# Patient Record
Sex: Female | Born: 1970 | Race: White | Hispanic: No | Marital: Single | Smoking: Never smoker
Health system: Southern US, Community
[De-identification: ages and names within clinical notes are randomized; demographics above are authoritative.]

## PROBLEM LIST (undated history)

## (undated) DIAGNOSIS — Z8659 Personal history of other mental and behavioral disorders: Secondary | ICD-10-CM

## (undated) DIAGNOSIS — L0591 Pilonidal cyst without abscess: Secondary | ICD-10-CM

## (undated) DIAGNOSIS — R1032 Left lower quadrant pain: Secondary | ICD-10-CM

## (undated) DIAGNOSIS — Z8719 Personal history of other diseases of the digestive system: Secondary | ICD-10-CM

## (undated) DIAGNOSIS — N83202 Unspecified ovarian cyst, left side: Secondary | ICD-10-CM

## (undated) DIAGNOSIS — Z87440 Personal history of urinary (tract) infections: Secondary | ICD-10-CM

## (undated) DIAGNOSIS — Z8669 Personal history of other diseases of the nervous system and sense organs: Secondary | ICD-10-CM

## (undated) HISTORY — DX: Personal history of other diseases of the nervous system and sense organs: Z86.69

## (undated) HISTORY — PX: ESOPHAGOGASTRODUODENOSCOPY: SHX1529

## (undated) HISTORY — PX: PILONIDAL CYST EXCISION: SHX744

## (undated) HISTORY — PX: COLONOSCOPY: SHX174

## (undated) HISTORY — DX: Personal history of other diseases of the digestive system: Z87.19

## (undated) HISTORY — DX: Left lower quadrant pain: R10.32

## (undated) HISTORY — PX: EYE SURGERY: SHX253

## (undated) HISTORY — DX: Personal history of other mental and behavioral disorders: Z86.59

## (undated) HISTORY — DX: Pilonidal cyst without abscess: L05.91

## (undated) HISTORY — DX: Personal history of urinary (tract) infections: Z87.440

## (undated) HISTORY — DX: Unspecified ovarian cyst, left side: N83.202

---

## 2006-11-12 ENCOUNTER — Encounter (HOSPITAL_BASED_OUTPATIENT_CLINIC_OR_DEPARTMENT_OTHER): Admission: RE | Admit: 2006-11-12 | Discharge: 2007-02-10 | Payer: Self-pay | Admitting: Surgery

## 2007-01-15 ENCOUNTER — Encounter (INDEPENDENT_AMBULATORY_CARE_PROVIDER_SITE_OTHER): Payer: Self-pay | Admitting: Surgery

## 2007-01-15 ENCOUNTER — Ambulatory Visit (HOSPITAL_COMMUNITY): Admission: RE | Admit: 2007-01-15 | Discharge: 2007-01-15 | Payer: Self-pay | Admitting: Surgery

## 2007-08-05 ENCOUNTER — Ambulatory Visit (HOSPITAL_BASED_OUTPATIENT_CLINIC_OR_DEPARTMENT_OTHER): Admission: RE | Admit: 2007-08-05 | Discharge: 2007-08-05 | Payer: Self-pay | Admitting: Surgery

## 2008-08-20 IMAGING — CR DG CHEST 2V
2 series · 2 of 2 positions shown · non-contrast
Comparison: none

CLINICAL DATA: 36 year-old female, preoperative study prior to therapy for pilonidal cyst.
 CHEST - 2 VIEW:

[w chest pa *]
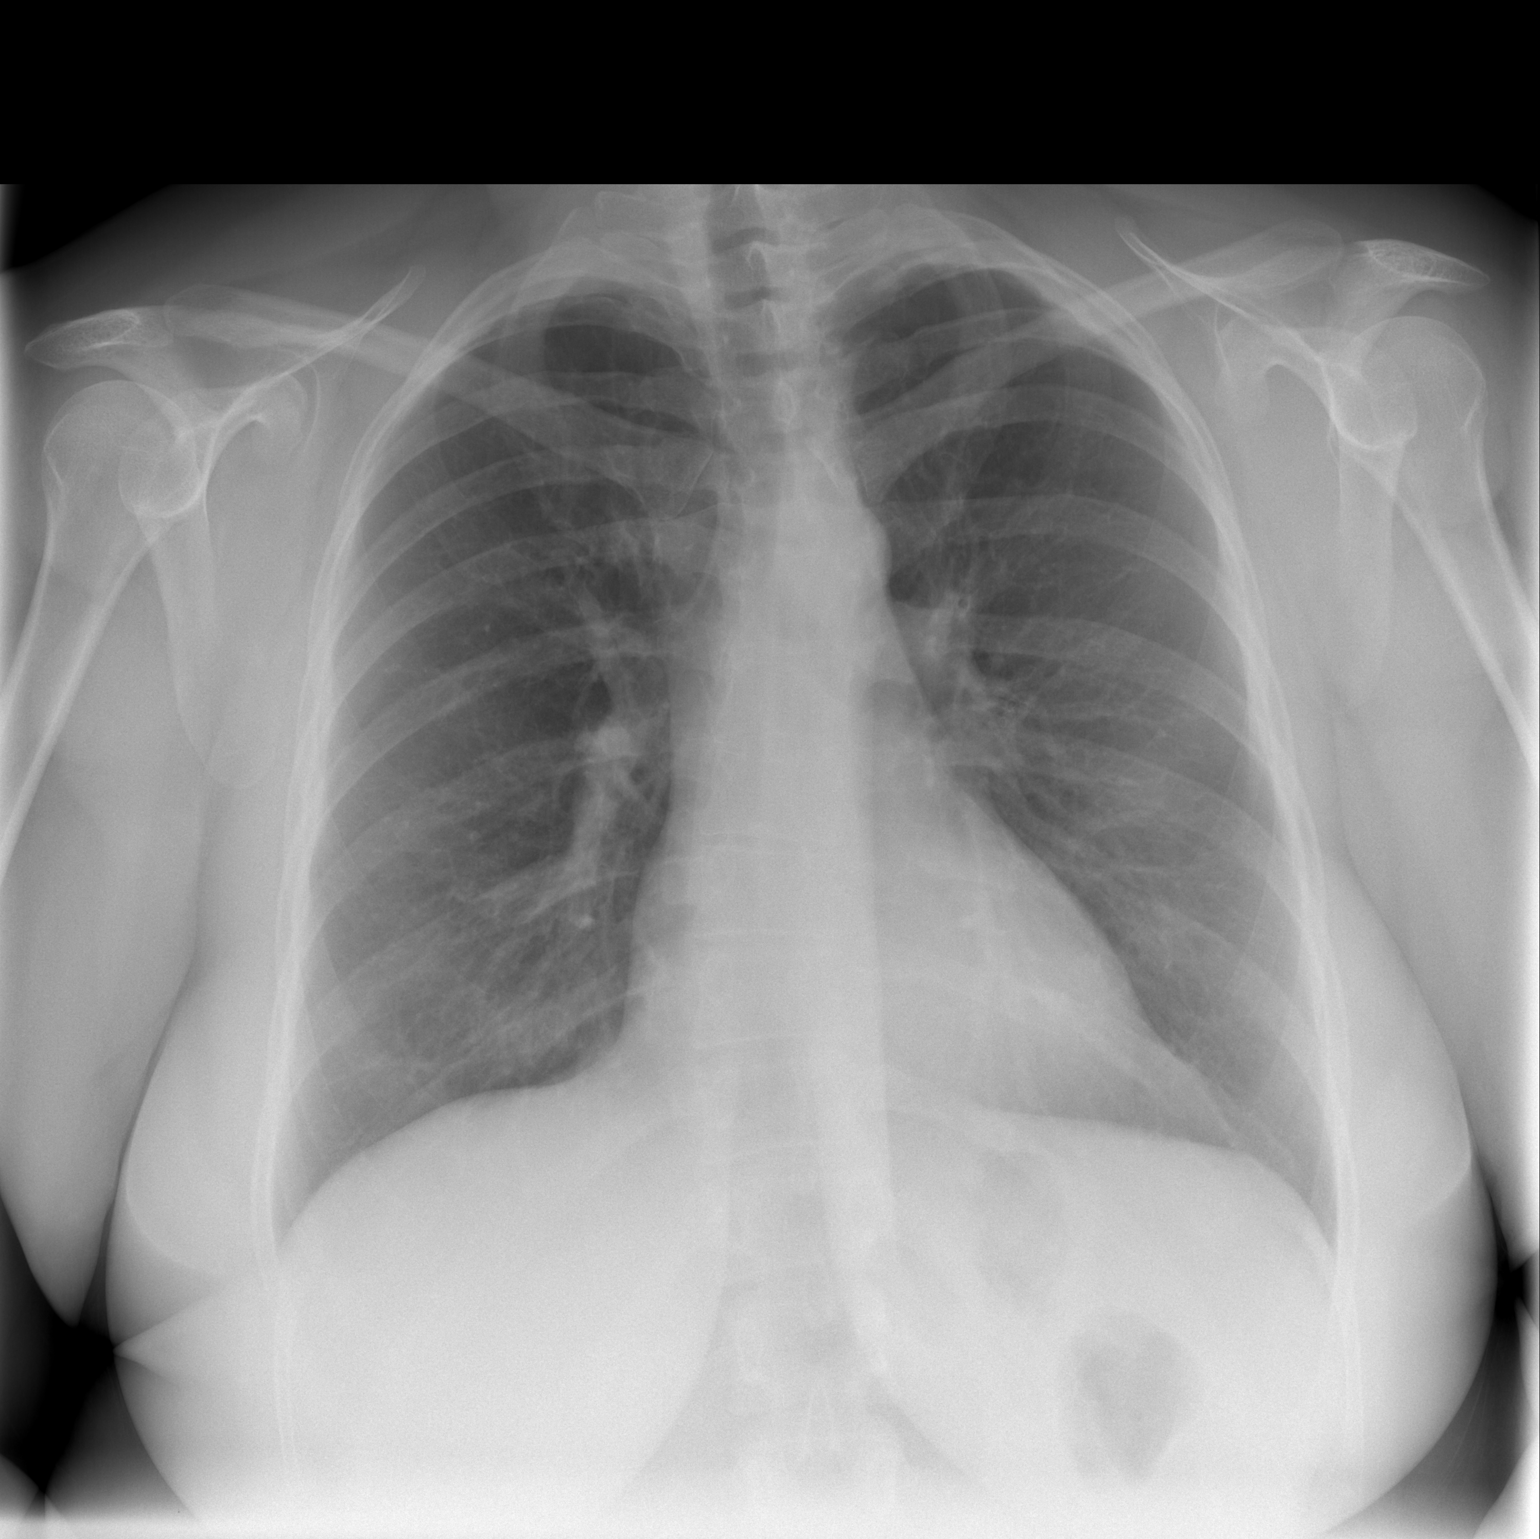

[w chest lat *]
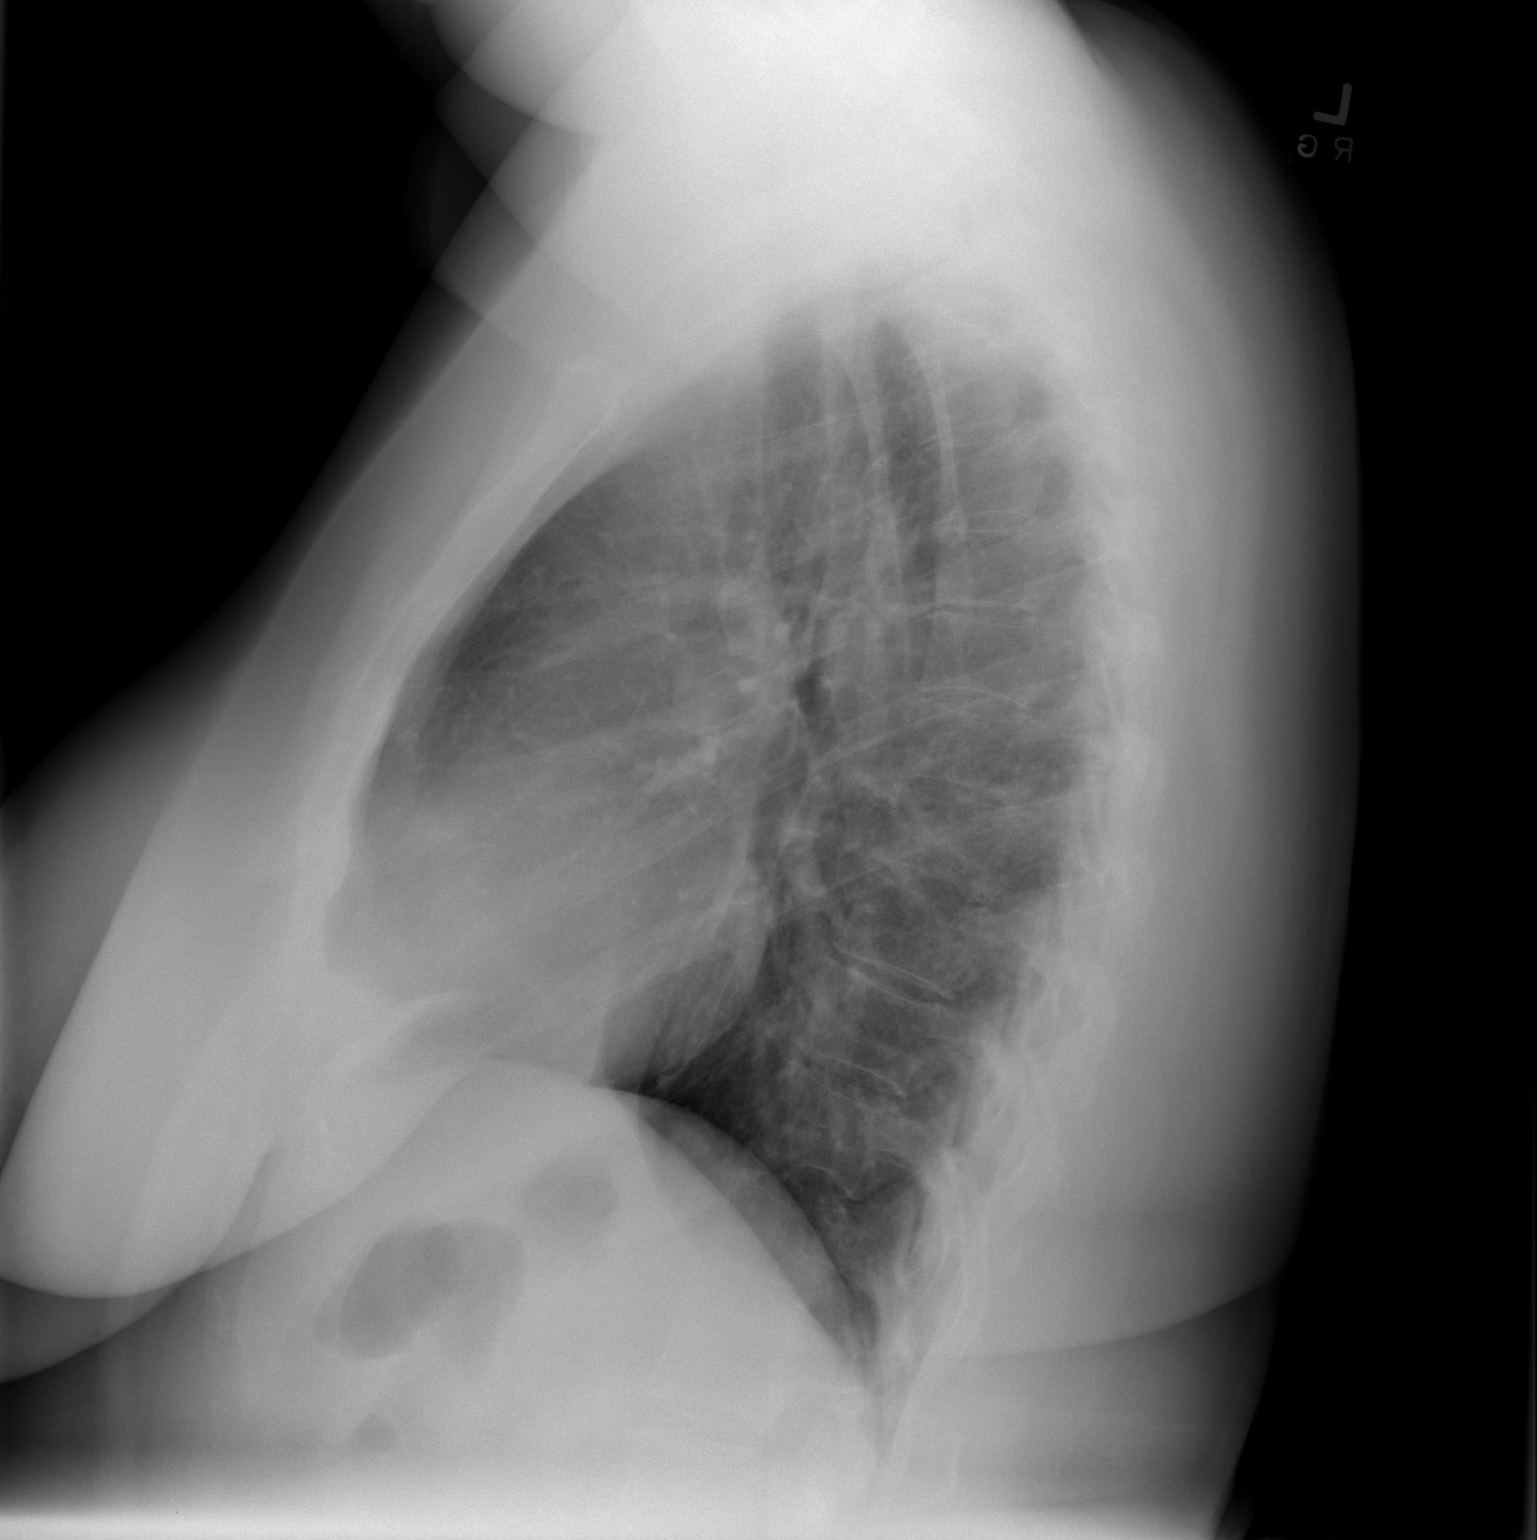

[2 of 2 positions shown; findings below may reference images not displayed]

FINDINGS: Normal cardiac size, mediastinal contour, and lung volumes. The lungs are clear. Mild thoracic scoliosis.  No acute osseous abnormality.
IMPRESSION: No acute cardiopulmonary abnormality.

## 2010-08-27 ENCOUNTER — Other Ambulatory Visit: Payer: Self-pay | Admitting: Internal Medicine

## 2010-08-27 DIAGNOSIS — R102 Pelvic and perineal pain: Secondary | ICD-10-CM

## 2010-08-29 ENCOUNTER — Ambulatory Visit
Admission: RE | Admit: 2010-08-29 | Discharge: 2010-08-29 | Disposition: A | Discharge status: Home / Self Care | Payer: Self-pay | Source: Ambulatory Visit | Attending: Internal Medicine | Admitting: Internal Medicine

## 2010-08-29 DIAGNOSIS — R102 Pelvic and perineal pain: Secondary | ICD-10-CM

## 2010-10-16 NOTE — Op Note (Signed)
NAME:  Catherine Elliott, Catherine Elliott                ACCOUNT NO.:  [id number]   MEDICAL RECORD NO.:  [id number]          PATIENT TYPE:  AMB   LOCATION:  DAY                          FACILITY:  [facility]   PHYSICIAN:  Thomas A. Cornett, M.D.DATE OF BIRTH:  05/12/1971   DATE OF PROCEDURE:  01/15/2007  DATE OF DISCHARGE:                               OPERATIVE REPORT   PREOPERATIVE DIAGNOSIS:  Chronic pilonidal cyst.   POSTOPERATIVE DIAGNOSIS:  Chronic pilonidal cyst.   PROCEDURE:  Excision of pilonidal cyst complex.   SURGEON:  Harriette Bouillon, M.D.   ANESTHESIA:  General endotracheal anesthesia with 20 mL of 0.25%  Sensorcaine with epinephrine.   ESTIMATED BLOOD LOSS:  20 mL.   SPECIMEN:  Pilonidal cyst tract down to the coccyx excised.   INDICATIONS FOR PROCEDURE:  The patient is a 40 year old female with a  complex history of pilonidal cysts which have been recurrent.  She  presents today for excision.   DESCRIPTION OF PROCEDURE:  The patient was brought to the operating  room, placed supine.  After induction of general anesthesia, she was  placed prone.  Buttocks were taped apart.  The gluteal cleft was prepped  and draped in sterile fashion.  Incision was made around the pilonidal  sinus tract in the midline all way down until we encountered the coccyx.  We then excised this area of tissue straight down to the coccyx.  The  entire was excised.  Hemostasis was excellent.  Sufficient skin flaps  were mobilized using electrocautery.  Wound was closed in layers with  two deep layers of 0 Vicryl and 2-0 Vicryl.  A third layer of Vicryl was  used to reapproximate the skin flaps with good coverage.  4-0 Monocryl  was then used to close skin.  All final counts of sponge, needle and  instruments were found to be correct for this portion of the case.  The  patient was then placed supine, extubated and taken to recovery in  satisfactory condition.  All final counts were correct.      Thomas A.  Cornett, M.D.  Electronically Signed     TAC/MEDQ  D:  01/15/2007  T:  01/16/2007  Job:  [id number]   cc:   Candyce Churn. Allyne Gee, M.D.  Fax: [phone]

## 2010-10-16 NOTE — Consult Note (Signed)
Catherine Elliott, Catherine Elliott                ACCOUNT NO.:  [id number]   MEDICAL RECORD NO.:  [id number]          PATIENT TYPE:  REC   LOCATION:  FOOT                         FACILITY:  [facility]   PHYSICIAN:  Jake Shark A. Tanda Rockers, M.D.DATE OF BIRTH:  1970/12/10   DATE OF CONSULTATION:  11/18/2006  DATE OF DISCHARGE:                                 CONSULTATION   REASON FOR CONSULTATION:  Catherine Elliott is a 40 year old lady referred by  Dr. Elnoria Howard for evaluation of ulcer of anus.   IMPRESSION:  Pilonidal cyst.   RECOMMENDATIONS:  General surgical consultation for consideration for  pilonidal cystectomy.   SUBJECTIVE:  Catherine Elliott is a 40 year old lady who has been under the  medical care of Dr. Dorothyann Peng and Dr. Jeani Hawking.  She was  initially evaluated eight months ago for a recurring sore in her rectal  area.  The patient describes minimum pain, but is significantly hampered  by bloody drainage.  She denies constipation, diarrhea.  She has had no  fever, no excessive malodor.  She has been unable to continue her  walking program as a result of increased soilage to her undergarment.   MEDICATIONS:  Remarkable for her not being on any medication.   ALLERGIES:  She denies allergies.   PAST SURGICAL HISTORY:  She has had no surgery.   FAMILY HISTORY:  Positive for colon cancer, diabetes, hypertension.  Negative for stroke.  Negative for heart attack.   SOCIAL HISTORY:  She is divorced.  She is employed by [employer].   REVIEW OF SYSTEMS:  She specifically denies chest pain, shortness of  breath.  She is a nonsmoker.  Her weight has increased due to her  inactivity related to the present lesion in the anal area.  The  remainder of the Review of Systems is negative.   PHYSICAL EXAMINATION:  GENERAL:  She is an alert oriented female in no  acute distress.  She is cooperative and in good contact with reality.  VITAL SIGNS:  Blood pressure is 141/76, respirations 18, pulse rate 78,  temperature  98.1.  HEENT:  Clear.  NECK:  Supple.  Trachea is midline.  Thyroid is nonpalpable.  LUNGS:  Clear.  BREASTS:  Deferred.  ABDOMEN:  Soft.  EXTREMITIES:  Remarkable for no edema.  NEUROLOGICAL:  The patient is grossly intact.  Inspection of the sacral coccygeal area discloses a classic pilonidal  cyst at the junction of the sacrum and coccyx.  There are no anal  lesions whatsoever.  There is mild hyperemia and tenderness.  A Q-tip  was used to sound the sinus, and the pilonidal sinus ends blindly.  There is mild discomfort which is well tolerated by the patient.   DISCUSSION:  The patient on physical exam has a pilonidal cyst.  We have  explained our recommendation that the patient be evaluated by one of the  staff general surgeons and be considered for a pilonidal cystectomy.  We  have encouraged the patient to pursue this option, and if need be we  will be happy to follow her up as sometimes pilonidal cyst  resolution  can be prolonged.  We have given the patient an opportunity to ask  questions.  She seems to understand the diagnosis and the  recommendation.  She expresses gratitude for having been seen in the  clinic, and indicates that she will be compliant as per above.   PRIMARY CARE PHYSICIAN:  Dr. Dorothyann Peng.      Harold A. Tanda Rockers, M.D.  Electronically Signed     HAN/MEDQ  D:  11/18/2006  T:  11/18/2006  Job:  [id number]   cc:   Jordan Hawks. Elnoria Howard, MD  Candyce Churn. Allyne Gee, M.D.

## 2010-10-16 NOTE — Op Note (Signed)
NAME:  Catherine Elliott, Catherine Elliott                ACCOUNT NO.:  [id number]   MEDICAL RECORD NO.:  [id number]          PATIENT TYPE:  AMB   LOCATION:  [facility]                         FACILITY:  [facility]   PHYSICIAN:  Thomas A. Cornett, M.D.DATE OF BIRTH:  1971/02/20   DATE OF PROCEDURE:  08/05/2007  DATE OF DISCHARGE:                               OPERATIVE REPORT   PREOPERATIVE DIAGNOSIS:  Non-healing sacral with recurrent pilonidal  cyst sinus tract.   POSTOPERATIVE DIAGNOSIS:  Non-healing sacral with recurrent pilonidal  cyst sinus tract.   PROCEDURE:  Debridement of recurrent pilonidal cyst sinus tract over  sacrum and coccyx.   SURGEON:  Maisie Fus A. Cornett, M.D.   ANESTHESIA:  General endotracheal anesthesia with 0.25% Sensorcaine.   ESTIMATED BLOOD LOSS:  20 mL.   SPECIMENS:  None.   INDICATIONS FOR PROCEDURE:  The patient is a 40 year old female who had  a pilonidal cyst excised about six months ago.  She has developed small  sinus tracts in the wound that have not healed with conservative  measures.  There was granulation tissue and, unfortunately, until this  was debrided, I did not think this was going to heal.  She presents  today for debridement of this.   DESCRIPTION OF PROCEDURE:  The patient was brought to the operating  suite and intubated supine.  She was then placed prone jackknife,  buttocks taped apart, and appropriately padded.  The wound over her  sacrum was identified.  There were two sinus tracts and these were  probed.  These went quite deep and I was able to connect the two sinus  tracts to each other.  I used cautery cutdown on the sinus tract until I  got to the very bottom.  There is significant granulation tissue down  deep and I used the cautery to destroy this.  I then packed it open with  saline soaked gauze.  Dry dressings were applied.  The patient tolerated  the procedure well, was then placed supine, extubated, and taken to  recovery in satisfactory  condition.  All final counts were correct.      Thomas A. Cornett, M.D.  Electronically Signed     TAC/MEDQ  D:  08/05/2007  T:  08/05/2007  Job:  [id number]

## 2011-02-25 LAB — POCT HEMOGLOBIN-HEMACUE
Hemoglobin: 14.2
Operator id: 268271

## 2011-02-25 LAB — POCT PREGNANCY, URINE
Operator id: 268271
Preg Test, Ur: NEGATIVE

## 2011-03-18 LAB — COMPREHENSIVE METABOLIC PANEL
ALT: 14
AST: 15
Albumin: 3.3 — ABNORMAL LOW
Alkaline Phosphatase: 106
BUN: 6
CO2: 26
Calcium: 9
Chloride: 107
Creatinine, Ser: 0.97
GFR calc Af Amer: >60
GFR calc non Af Amer: >60
Glucose, Bld: 103 — ABNORMAL HIGH
Potassium: 4
Sodium: 139
Total Bilirubin: 0.9
Total Protein: 7.4

## 2011-03-18 LAB — DIFFERENTIAL
Basophils Absolute: 0.1
Basophils Relative: 1
Eosinophils Absolute: 0.1
Eosinophils Relative: 1
Lymphocytes Relative: 28
Lymphs Abs: 2.3
Monocytes Absolute: 0.5
Monocytes Relative: 6
Neutro Abs: 5.2
Neutrophils Relative %: 64

## 2011-03-18 LAB — PREGNANCY, URINE: Preg Test, Ur: NEGATIVE

## 2011-03-18 LAB — CBC
HCT: 40.2
Hemoglobin: 13.8
MCHC: 34.3
MCV: 83.6
Platelets: 340
RBC: 4.8
RDW: 13.1
WBC: 8.2

## 2011-11-27 ENCOUNTER — Ambulatory Visit: Payer: Self-pay | Admitting: Obstetrics and Gynecology

## 2011-12-19 ENCOUNTER — Ambulatory Visit (INDEPENDENT_AMBULATORY_CARE_PROVIDER_SITE_OTHER): Payer: BC Managed Care – PPO | Admitting: Obstetrics and Gynecology

## 2011-12-19 ENCOUNTER — Encounter: Payer: Self-pay | Admitting: Obstetrics and Gynecology

## 2011-12-19 VITALS — BP 110/72 | Ht 67.0 in | Wt 244.0 lb

## 2011-12-19 DIAGNOSIS — Z01419 Encounter for gynecological examination (general) (routine) without abnormal findings: Secondary | ICD-10-CM | POA: Insufficient documentation

## 2011-12-19 DIAGNOSIS — Z124 Encounter for screening for malignant neoplasm of cervix: Secondary | ICD-10-CM

## 2011-12-19 NOTE — Progress Notes (Signed)
Last Pap: 10/22/2010 WNL: Yes Regular Periods:yes Contraception: abst  Monthly Breast exam:no Tetanus<61yrs:yes Nl.Bladder Function:yes Daily BMs:yes Healthy Diet:yes Calcium:no Mammogram:no Date of Mammogram: n/a Exercise:yes Have often Exercise: 3-4x weekly Seatbelt: yes Abuse at home: no Stressful work:no Sigmoid-colonoscopy: <1year ago-wnl per pt Dr.Hung. Pt had no evidence ulcerative colitis at colonoscopy PCP: Dr.Sanders Change in PMH: no change Change in FMH:no change BMI=36 Subjective:    Catherine Elliott is a 41 y.o. female, G0P0000, who presents for an annual exam without complaints.  She had a left ovarian cyst which resolved spontaneously 11/2010    History   Social History  . Marital Status: Single    Spouse Name: N/A    Number of Children: N/A  . Years of Education: N/A   Social History Main Topics  . Smoking status: Never Smoker   . Smokeless tobacco: Never Used  . Alcohol Use: No  . Drug Use: No  . Sexually Active: Not Currently    Birth Control/ Protection: Abstinence   Other Topics Concern  . None   Social History Narrative  . None    Menstrual cycle:   LMP: Patient's last menstrual period was 12/05/2011.           Cycle: regular for the5-7 days and no IM bleeding at the  The following portions of the patient's history were reviewed and updated as appropriate: allergies, current medications, past family history, past medical history, past social history, past surgical history and problem list.  Review of Systems Pertinent items are noted in HPI. Breast:Negative for breast lump,nipple discharge or nipple retraction Gastrointestinal: Negative for abdominal pain, change in bowel habits or rectal bleeding Urinary:negative   Objective:    BP 110/72  Ht 5\' 7"  (1.702 m)  Wt 244 lb (110.678 kg)  BMI 38.22 kg/m2  LMP 12/05/2011    Weight:  Wt Readings from Last 1 Encounters:  12/19/11 244 lb (110.678 kg)          BMI: Body mass index is  38.22 kg/(m^2).  General Appearance: Alert, appropriate appearance for age. No acute distress HEENT: Grossly normal Neck / Thyroid: Supple, no masses, nodes or enlargement Lungs: clear to auscultation bilaterally Back: No CVA tenderness Breast Exam: No masses or nodes.No dimpling, nipple retraction or discharge. Cardiovascular: Regular rate and rhythm. S1, S2, no murmur Gastrointestinal: Soft, non-tender, no masses or organomegaly Pelvic Exam: External genitalia: normal general appearance Vaginal: normal mucosa without prolapse or lesions Cervix: normal appearance Adnexa: non palpable Uterus: does not feel enlarged Exam limited by body habitus Rectovaginal: normal rectal, no masses Lymphatic Exam: Non-palpable nodes in neck, clavicular, axillary, or inguinal regions Skin: no rash or abnormalities Neurologic: Normal gait and speech, no tremor  Psychiatric: Alert and oriented, appropriate affect.   Wet Prep:not applicable Urinalysis:not applicable UPT: Not done   Assessment:    Normal gyn exam compromised by patient's habitus  Hx ovarian cyst , resolved.    Plan:    Mammogram.  Needs first scheduled pap smear return annually or prn STD screening: declined, not sexually active Contraception:declined      Mitsy Owen PMD

## 2011-12-20 ENCOUNTER — Telehealth: Payer: Self-pay

## 2011-12-20 DIAGNOSIS — Z1231 Encounter for screening mammogram for malignant neoplasm of breast: Secondary | ICD-10-CM

## 2011-12-20 NOTE — Telephone Encounter (Signed)
Lm on vm to cb to sched screening mammo per vph.

## 2011-12-20 NOTE — Telephone Encounter (Signed)
Tc from pt per telephone call. Appt sched 01/09/12@11 :20 with [facility]. Pt voices understanding.

## 2011-12-24 LAB — PAP IG AND HPV HIGH-RISK: HPV DNA High Risk: NOT DETECTED

## 2012-01-09 ENCOUNTER — Ambulatory Visit
Admission: RE | Admit: 2012-01-09 | Discharge: 2012-01-09 | Disposition: A | Discharge status: Home / Self Care | Payer: BC Managed Care – PPO | Source: Ambulatory Visit | Attending: Obstetrics and Gynecology | Admitting: Obstetrics and Gynecology

## 2012-01-09 DIAGNOSIS — Z1231 Encounter for screening mammogram for malignant neoplasm of breast: Secondary | ICD-10-CM

## 2012-01-13 ENCOUNTER — Encounter: Payer: Self-pay | Admitting: Obstetrics and Gynecology

## 2013-03-04 ENCOUNTER — Other Ambulatory Visit: Payer: Self-pay

## 2013-03-04 DIAGNOSIS — Z1231 Encounter for screening mammogram for malignant neoplasm of breast: Secondary | ICD-10-CM

## 2013-03-05 ENCOUNTER — Ambulatory Visit
Admission: RE | Admit: 2013-03-05 | Discharge: 2013-03-05 | Disposition: A | Discharge status: Home / Self Care | Payer: BC Managed Care – PPO | Source: Ambulatory Visit

## 2013-03-05 DIAGNOSIS — Z1231 Encounter for screening mammogram for malignant neoplasm of breast: Secondary | ICD-10-CM

## 2013-03-10 ENCOUNTER — Other Ambulatory Visit: Payer: Self-pay | Admitting: Internal Medicine

## 2013-03-10 DIAGNOSIS — R928 Other abnormal and inconclusive findings on diagnostic imaging of breast: Secondary | ICD-10-CM

## 2013-03-23 ENCOUNTER — Other Ambulatory Visit: Payer: Self-pay | Admitting: Internal Medicine

## 2013-03-23 ENCOUNTER — Ambulatory Visit
Admission: RE | Admit: 2013-03-23 | Discharge: 2013-03-23 | Disposition: A | Discharge status: Home / Self Care | Payer: BC Managed Care – PPO | Source: Ambulatory Visit | Attending: Internal Medicine | Admitting: Internal Medicine

## 2013-03-23 DIAGNOSIS — R928 Other abnormal and inconclusive findings on diagnostic imaging of breast: Secondary | ICD-10-CM

## 2013-10-11 ENCOUNTER — Other Ambulatory Visit: Payer: Self-pay | Admitting: Internal Medicine

## 2013-10-11 DIAGNOSIS — N63 Unspecified lump in unspecified breast: Secondary | ICD-10-CM

## 2013-10-18 ENCOUNTER — Ambulatory Visit
Admission: RE | Admit: 2013-10-18 | Discharge: 2013-10-18 | Disposition: A | Discharge status: Home / Self Care | Payer: BC Managed Care – PPO | Source: Ambulatory Visit | Attending: Internal Medicine | Admitting: Internal Medicine

## 2013-10-18 ENCOUNTER — Other Ambulatory Visit: Payer: Self-pay | Admitting: Internal Medicine

## 2013-10-18 ENCOUNTER — Encounter (INDEPENDENT_AMBULATORY_CARE_PROVIDER_SITE_OTHER): Payer: Self-pay

## 2013-10-18 DIAGNOSIS — N63 Unspecified lump in unspecified breast: Secondary | ICD-10-CM

## 2014-09-30 ENCOUNTER — Other Ambulatory Visit (HOSPITAL_COMMUNITY): Payer: Self-pay | Admitting: Internal Medicine

## 2014-09-30 DIAGNOSIS — R1310 Dysphagia, unspecified: Secondary | ICD-10-CM

## 2014-10-10 ENCOUNTER — Ambulatory Visit (HOSPITAL_COMMUNITY)
Admission: RE | Admit: 2014-10-10 | Discharge: 2014-10-10 | Disposition: A | Discharge status: Home / Self Care | Payer: BC Managed Care – PPO | Source: Ambulatory Visit | Attending: Internal Medicine | Admitting: Internal Medicine

## 2014-10-10 DIAGNOSIS — K449 Diaphragmatic hernia without obstruction or gangrene: Secondary | ICD-10-CM | POA: Diagnosis not present

## 2014-10-10 DIAGNOSIS — R1310 Dysphagia, unspecified: Secondary | ICD-10-CM

## 2015-02-23 ENCOUNTER — Other Ambulatory Visit: Payer: Self-pay

## 2015-02-23 DIAGNOSIS — Z1231 Encounter for screening mammogram for malignant neoplasm of breast: Secondary | ICD-10-CM

## 2015-02-24 ENCOUNTER — Ambulatory Visit
Admission: RE | Admit: 2015-02-24 | Discharge: 2015-02-24 | Disposition: A | Discharge status: Home / Self Care | Payer: BC Managed Care – PPO | Source: Ambulatory Visit

## 2015-02-24 DIAGNOSIS — Z1231 Encounter for screening mammogram for malignant neoplasm of breast: Secondary | ICD-10-CM

## 2015-10-09 ENCOUNTER — Ambulatory Visit: Payer: Self-pay

## 2015-10-09 ENCOUNTER — Encounter: Payer: Self-pay | Admitting: Podiatry

## 2015-10-09 ENCOUNTER — Ambulatory Visit (INDEPENDENT_AMBULATORY_CARE_PROVIDER_SITE_OTHER): Payer: BC Managed Care – PPO | Admitting: Podiatry

## 2015-10-09 ENCOUNTER — Ambulatory Visit (INDEPENDENT_AMBULATORY_CARE_PROVIDER_SITE_OTHER): Payer: BC Managed Care – PPO

## 2015-10-09 VITALS — BP 118/59 | HR 72 | Resp 16 | Ht 68.0 in | Wt 210.0 lb

## 2015-10-09 DIAGNOSIS — L84 Corns and callosities: Secondary | ICD-10-CM | POA: Diagnosis not present

## 2015-10-09 DIAGNOSIS — M79672 Pain in left foot: Secondary | ICD-10-CM

## 2015-10-09 DIAGNOSIS — M79671 Pain in right foot: Secondary | ICD-10-CM

## 2015-10-09 DIAGNOSIS — M779 Enthesopathy, unspecified: Secondary | ICD-10-CM | POA: Diagnosis not present

## 2015-10-09 DIAGNOSIS — M216X9 Other acquired deformities of unspecified foot: Secondary | ICD-10-CM

## 2015-10-09 MED ORDER — TRIAMCINOLONE ACETONIDE 10 MG/ML IJ SUSP
10.0000 mg | Freq: Once | INTRAMUSCULAR | Status: AC
  Administered 2015-10-09: 10 mg

## 2015-10-09 NOTE — Progress Notes (Signed)
   Subjective:    Patient ID: Catherine Elliott, female    DOB: 05/27/1971, 45 y.o.   MRN: [id number]  HPI Chief Complaint  Patient presents with  . Painful lesions    Bilateral; lateral side-below 5th toe; pt stated, "Right foot hurts worse"      Review of Systems  Musculoskeletal: Positive for arthralgias.  Hematological: Bruises/bleeds easily.  All other systems reviewed and are negative.      Objective:   Physical Exam        Assessment & Plan:

## 2015-10-10 NOTE — Progress Notes (Signed)
Subjective:     Patient ID: Catherine Elliott, female   DOB: 03/30/1971, 45 y.o.   MRN: [id number]  HPI patient states that both my feet hurt but the right one seems worse and it's bad on this outside area. I developed a corn about 6 months ago and on the left one over the last several months and they're quite painful when I stepped down   Review of Systems  All other systems reviewed and are negative.      Objective:   Physical Exam  Constitutional: She is oriented to person, place, and time.  Cardiovascular: Intact distal pulses.   Musculoskeletal: Normal range of motion.  Neurological: She is oriented to person, place, and time.  Skin: Skin is warm.  Nursing note and vitals reviewed.  neurovascular status intact muscle strength adequate range of motion within normal limits with patient found to have inflammation and fluid around the fifth metatarsal head right that's painful when pressed making walking and shoe gear difficult. Patient has keratotic tissue formation bilateral fifth metatarsals and is found to have good digital perfusion and is well oriented 3     Assessment:     Capsulitis fifth MPJ right over left with keratotic tissue formation and pain    Plan:     H&P x-rays reviewed with patient. Today I injected the plantar capsule right 3 mg Dexon some Kenalog 5 mg Xylocaine and then after appropriate numbness debrided lesions bilateral with no iatrogenic bleeding noted. Gave instructions on physical therapy and reappoint to recheck  X-ray report indicates that there is inflammation around the fifth metatarsal head bilateral

## 2017-06-12 LAB — HM COLONOSCOPY

## 2017-08-07 ENCOUNTER — Other Ambulatory Visit: Payer: Self-pay | Admitting: Internal Medicine

## 2017-08-07 DIAGNOSIS — Z1231 Encounter for screening mammogram for malignant neoplasm of breast: Secondary | ICD-10-CM

## 2017-08-26 ENCOUNTER — Ambulatory Visit
Admission: RE | Admit: 2017-08-26 | Discharge: 2017-08-26 | Disposition: A | Discharge status: Home / Self Care | Payer: BC Managed Care – PPO | Source: Ambulatory Visit | Attending: Internal Medicine | Admitting: Internal Medicine

## 2017-08-26 DIAGNOSIS — Z1231 Encounter for screening mammogram for malignant neoplasm of breast: Secondary | ICD-10-CM

## 2018-04-22 ENCOUNTER — Other Ambulatory Visit: Payer: Self-pay | Admitting: Internal Medicine

## 2018-05-18 ENCOUNTER — Other Ambulatory Visit: Payer: Self-pay | Admitting: Internal Medicine

## 2018-06-03 ENCOUNTER — Other Ambulatory Visit: Payer: Self-pay | Admitting: Internal Medicine

## 2018-07-29 ENCOUNTER — Other Ambulatory Visit: Payer: Self-pay | Admitting: Internal Medicine

## 2018-07-29 DIAGNOSIS — Z1231 Encounter for screening mammogram for malignant neoplasm of breast: Secondary | ICD-10-CM

## 2018-08-17 ENCOUNTER — Other Ambulatory Visit: Payer: Self-pay | Admitting: Internal Medicine

## 2018-08-31 ENCOUNTER — Inpatient Hospital Stay: Admission: RE | Admit: 2018-08-31 | Payer: BC Managed Care – PPO | Source: Ambulatory Visit

## 2018-09-10 ENCOUNTER — Encounter: Payer: BC Managed Care – PPO | Admitting: Internal Medicine

## 2018-10-15 ENCOUNTER — Other Ambulatory Visit: Payer: Self-pay | Admitting: Internal Medicine

## 2019-02-09 ENCOUNTER — Other Ambulatory Visit: Payer: Self-pay

## 2019-02-09 ENCOUNTER — Encounter: Payer: Self-pay | Admitting: Internal Medicine

## 2019-02-09 ENCOUNTER — Ambulatory Visit: Payer: BC Managed Care – PPO | Admitting: Internal Medicine

## 2019-02-09 VITALS — BP 130/72 | HR 90 | Temp 98.5°F | Ht 65.2 in | Wt 261.2 lb

## 2019-02-09 DIAGNOSIS — Z6841 Body Mass Index (BMI) 40.0 and over, adult: Secondary | ICD-10-CM

## 2019-02-09 DIAGNOSIS — Z0000 Encounter for general adult medical examination without abnormal findings: Secondary | ICD-10-CM

## 2019-02-09 DIAGNOSIS — G8929 Other chronic pain: Secondary | ICD-10-CM | POA: Diagnosis not present

## 2019-02-09 DIAGNOSIS — Z712 Person consulting for explanation of examination or test findings: Secondary | ICD-10-CM | POA: Diagnosis not present

## 2019-02-09 DIAGNOSIS — R1011 Right upper quadrant pain: Secondary | ICD-10-CM | POA: Diagnosis not present

## 2019-02-09 LAB — POCT URINALYSIS DIPSTICK
Blood, UA: NEGATIVE
Glucose, UA: NEGATIVE
Nitrite, UA: NEGATIVE
Protein, UA: POSITIVE — AB
Spec Grav, UA: 1.025 (ref 1.010–1.025)
Urobilinogen, UA: 0.2 E.U./dL
pH, UA: 5.5 (ref 5.0–8.0)

## 2019-02-09 NOTE — Progress Notes (Signed)
Subjective:     Patient ID: Catherine Elliott , female    DOB: [birth year] , 48 y.o.   MRN: [id number]   Chief Complaint  Patient presents with  . Annual Exam    HPI  She is here today for a full physical examination. She is followed by GYN for her pelvic exams. Admits that she has not been seen in "years". She was previously seen by Dr. Leo Grosser; however, she heard she has since retired.     Past Medical History:  Diagnosis Date  . H/O ulcerative colitis   . H/O: depression   . History of migraine headaches    frequent  . Hx: UTI (urinary tract infection)   . Left ovarian cyst    resolved 11/2010  . LLQ pain    h/o  . Pilonidal cyst    h/o x 2     Family History  Problem Relation Age of Onset  . COPD Mother   . Cancer Mother        ovarian  . Hypertension Mother   . Heart disease Father   . Nephrolithiasis Father   . Hypertension Father   . Diabetes Father   . Seizures Father   . Mental illness Sister        bipolar   . Seizures Brother   . Mental illness Brother        bipolar  . Cirrhosis Brother        ETOH use  . Cancer Maternal Aunt        stomach and uterine  . Cancer Maternal Grandmother        ovarian  . Cancer Paternal Grandmother        pancreatic     Current Outpatient Medications:  .  cholestyramine (QUESTRAN) 4 GM/DOSE powder, Take by mouth 3 (three) times daily with meals., Disp: , Rfl:  .  famotidine (PEPCID) 20 MG tablet, TAKE 1 TABLET BY MOUTH TWICE A DAY, Disp: 60 tablet, Rfl: 1 .  omeprazole (PRILOSEC) 40 MG capsule, Take 40 mg by mouth daily., Disp: , Rfl:    No Known Allergies    The patient states she uses none for birth control. Last LMP was Patient's last menstrual period was 01/31/2019.. Negative for Dysmenorrhea  Negative for: breast discharge, breast lump(s), breast pain and breast self exam. Associated symptoms include abnormal vaginal bleeding. Pertinent negatives include abnormal bleeding (hematology), anxiety, decreased libido,  depression, difficulty falling sleep, dyspareunia, history of infertility, nocturia, sexual dysfunction, sleep disturbances, urinary incontinence, urinary urgency, vaginal discharge and vaginal itching. Diet regular.The patient states her exercise level is  intermittent.  . The patient's tobacco use is:  Social History   Tobacco Use  Smoking Status Never Smoker  Smokeless Tobacco Never Used  . She has been exposed to passive smoke. The patient's alcohol use is:  Social History   Substance and Sexual Activity  Alcohol Use No    Review of Systems  Constitutional: Negative.   HENT: Negative.   Eyes: Negative.   Respiratory: Negative.   Cardiovascular: Negative.   Gastrointestinal:       She c/o intermittent nausea and RUQ pain. Had recent ER evaluation - diagnosed w/ "stomach flu". Yet CT abdomen revealed cholelithiasis without inflammation. She has been having sx for the past 3 months or so. She is also followed by GI.   Endocrine: Negative.   Genitourinary: Negative.   Musculoskeletal: Negative.   Skin: Negative.   Allergic/Immunologic: Negative.   Neurological: Negative.  Hematological: Negative.   Psychiatric/Behavioral: Negative.      Today's Vitals   02/09/19 0858  BP: 130/72  Pulse: 90  Temp: 98.5 F (36.9 C)  TempSrc: Oral  SpO2: 98%  Weight: 261 lb 3.2 oz (118.5 kg)  Height: 5' 5.2" (1.656 m)   Body mass index is 43.2 kg/m.   Objective:  Physical Exam Vitals signs and nursing note reviewed.  Constitutional:      Appearance: Normal appearance. She is obese.  HENT:     Head: Normocephalic and atraumatic.     Right Ear: Tympanic membrane, ear canal and external ear normal.     Left Ear: Tympanic membrane, ear canal and external ear normal.     Nose: Nose normal.     Mouth/Throat:     Mouth: Mucous membranes are moist.     Pharynx: Oropharynx is clear.  Eyes:     Extraocular Movements: Extraocular movements intact.     Conjunctiva/sclera: Conjunctivae  normal.     Pupils: Pupils are equal, round, and reactive to light.  Neck:     Musculoskeletal: Normal range of motion and neck supple.  Cardiovascular:     Rate and Rhythm: Normal rate and regular rhythm.     Pulses: Normal pulses.     Heart sounds: Normal heart sounds.  Pulmonary:     Effort: Pulmonary effort is normal.     Breath sounds: Normal breath sounds.  Abdominal:     General: Bowel sounds are normal.     Palpations: Abdomen is soft.     Tenderness: There is abdominal tenderness.     Comments: Obese, difficult to assess organomegaly. RUQ tenderness to palpation  Genitourinary:    Comments: deferred Musculoskeletal: Normal range of motion.  Skin:    General: Skin is warm and dry.  Neurological:     General: No focal deficit present.     Mental Status: She is alert and oriented to person, place, and time.  Psychiatric:        Mood and Affect: Mood normal.        Behavior: Behavior normal.         Assessment And Plan:     1. Routine general medical examination at health care facility  A full exam was performed.  Importance of monthly self breast exams was discussed with the patient.  I will refer her back to [facility] for her GYN care.  PATIENT HAS BEEN ADVISED TO GET 30-45 MINUTES REGULAR EXERCISE NO LESS THAN FOUR TO FIVE DAYS PER WEEK - BOTH WEIGHTBEARING EXERCISES AND AEROBIC ARE RECOMMENDED.  SHE WAS ADVISED TO FOLLOW A HEALTHY DIET WITH AT LEAST SIX FRUITS/VEGGIES PER DAY, DECREASE INTAKE OF RED MEAT, AND TO INCREASE FISH INTAKE TO TWO DAYS PER WEEK.  MEATS/FISH SHOULD NOT BE FRIED, BAKED OR BROILED IS PREFERABLE.  I SUGGEST WEARING SPF 50 SUNSCREEN ON EXPOSED PARTS AND ESPECIALLY WHEN IN THE DIRECT SUNLIGHT FOR AN EXTENDED PERIOD OF TIME.  PLEASE AVOID FAST FOOD RESTAURANTS AND INCREASE YOUR WATER INTAKE.  - Ambulatory referral to Gynecology - CMP14+EGFR - CBC - Lipid panel - Hemoglobin A1c - TSH - POCT Urinalysis Dipstick (81002)  2. Chronic RUQ pain  CT scan  results reviewed in full detail. I will make further recommendations once her labs are available for review.   - NM Hepato W/Eject Fract; Future  3. Class 3 severe obesity due to excess calories without serious comorbidity with body mass index (BMI) of 40.0 to 44.9 in adult (  [place])  She reports losing 10-15 pounds in the past 2 months by decreasing her food intake (due to GI discomfort) and increasing her water intake. She admits she is not yet exercising on a regular basis. She is encouraged to incorporate more activity into her daily routine.   Maximino Greenland, MD    THE PATIENT IS ENCOURAGED TO PRACTICE SOCIAL DISTANCING DUE TO THE COVID-19 PANDEMIC.

## 2019-02-09 NOTE — Patient Instructions (Signed)
Health Maintenance, Female Adopting a healthy lifestyle and getting preventive care are important in promoting health and wellness. Ask your health care provider about:  The right schedule for you to have regular tests and exams.  Things you can do on your own to prevent diseases and keep yourself healthy. What should I know about diet, weight, and exercise? Eat a healthy diet   Eat a diet that includes plenty of vegetables, fruits, low-fat dairy products, and lean protein.  Do not eat a lot of foods that are high in solid fats, added sugars, or sodium. Maintain a healthy weight Body mass index (BMI) is used to identify weight problems. It estimates body fat based on height and weight. Your health care provider can help determine your BMI and help you achieve or maintain a healthy weight. Get regular exercise Get regular exercise. This is one of the most important things you can do for your health. Most adults should:  Exercise for at least 150 minutes each week. The exercise should increase your heart rate and make you sweat (moderate-intensity exercise).  Do strengthening exercises at least twice a week. This is in addition to the moderate-intensity exercise.  Spend less time sitting. Even light physical activity can be beneficial. Watch cholesterol and blood lipids Have your blood tested for lipids and cholesterol at 50 years of age, then have this test every 5 years. Have your cholesterol levels checked more often if:  Your lipid or cholesterol levels are high.  You are older than 50 years of age.  You are at high risk for heart disease. What should I know about cancer screening? Depending on your health history and family history, you may need to have cancer screening at various ages. This may include screening for:  Breast cancer.  Cervical cancer.  Colorectal cancer.  Skin cancer.  Lung cancer. What should I know about heart disease, diabetes, and high blood  pressure? Blood pressure and heart disease  High blood pressure causes heart disease and increases the risk of stroke. This is more likely to develop in people who have high blood pressure readings, are of African descent, or are overweight.  Have your blood pressure checked: ? Every 3-5 years if you are 18-39 years of age. ? Every year if you are 40 years old or older. Diabetes Have regular diabetes screenings. This checks your fasting blood sugar level. Have the screening done:  Once every three years after age 40 if you are at a normal weight and have a low risk for diabetes.  More often and at a younger age if you are overweight or have a high risk for diabetes. What should I know about preventing infection? Hepatitis B If you have a higher risk for hepatitis B, you should be screened for this virus. Talk with your health care provider to find out if you are at risk for hepatitis B infection. Hepatitis C Testing is recommended for:  Everyone born from 1945 through 1965.  Anyone with known risk factors for hepatitis C. Sexually transmitted infections (STIs)  Get screened for STIs, including gonorrhea and chlamydia, if: ? You are sexually active and are younger than 50 years of age. ? You are older than 50 years of age and your health care provider tells you that you are at risk for this type of infection. ? Your sexual activity has changed since you were last screened, and you are at increased risk for chlamydia or gonorrhea. Ask your health care provider if   you are at risk.  Ask your health care provider about whether you are at high risk for HIV. Your health care provider may recommend a prescription medicine to help prevent HIV infection. If you choose to take medicine to prevent HIV, you should first get tested for HIV. You should then be tested every 3 months for as long as you are taking the medicine. Pregnancy  If you are about to stop having your period (premenopausal) and  you may become pregnant, seek counseling before you get pregnant.  Take 400 to 800 micrograms (mcg) of folic acid every day if you become pregnant.  Ask for birth control (contraception) if you want to prevent pregnancy. Osteoporosis and menopause Osteoporosis is a disease in which the bones lose minerals and strength with aging. This can result in bone fractures. If you are 65 years old or older, or if you are at risk for osteoporosis and fractures, ask your health care provider if you should:  Be screened for bone loss.  Take a calcium or vitamin D supplement to lower your risk of fractures.  Be given hormone replacement therapy (HRT) to treat symptoms of menopause. Follow these instructions at home: Lifestyle  Do not use any products that contain nicotine or tobacco, such as cigarettes, e-cigarettes, and chewing tobacco. If you need help quitting, ask your health care provider.  Do not use street drugs.  Do not share needles.  Ask your health care provider for help if you need support or information about quitting drugs. Alcohol use  Do not drink alcohol if: ? Your health care provider tells you not to drink. ? You are pregnant, may be pregnant, or are planning to become pregnant.  If you drink alcohol: ? Limit how much you use to 0-1 drink a day. ? Limit intake if you are breastfeeding.  Be aware of how much alcohol is in your drink. In the [place] one drink equals one 12 oz bottle of beer (355 mL), one 5 oz glass of wine (148 mL), or one 1 oz glass of hard liquor (44 mL). General instructions  Schedule regular health, dental, and eye exams.  Stay current with your vaccines.  Tell your health care provider if: ? You often feel depressed. ? You have ever been abused or do not feel safe at home. Summary  Adopting a healthy lifestyle and getting preventive care are important in promoting health and wellness.  Follow your health care provider's instructions about healthy  diet, exercising, and getting tested or screened for diseases.  Follow your health care provider's instructions on monitoring your cholesterol and blood pressure. This information is not intended to replace advice given to you by your health care provider. Make sure you discuss any questions you have with your health care provider. Document Released: 12/03/2010 Document Revised: 05/13/2018 Document Reviewed: 05/13/2018 Elsevier Patient Education  2020 [employer]  

## 2019-02-10 LAB — CMP14+EGFR
ALT: 27 IU/L (ref 0–32)
AST: 15 IU/L (ref 0–40)
Albumin/Globulin Ratio: 1.3 (ref 1.2–2.2)
Albumin: 4.2 g/dL (ref 3.8–4.8)
Alkaline Phosphatase: 135 IU/L — ABNORMAL HIGH (ref 39–117)
BUN/Creatinine Ratio: 9 (ref 9–23)
BUN: 8 mg/dL (ref 6–24)
Bilirubin Total: 0.4 mg/dL (ref 0.0–1.2)
CO2: 21 mmol/L (ref 20–29)
Calcium: 9.6 mg/dL (ref 8.7–10.2)
Chloride: 103 mmol/L (ref 96–106)
Creatinine, Ser: 0.9 mg/dL (ref 0.57–1.00)
GFR calc Af Amer: 86 mL/min/{1.73_m2} (ref >59)
GFR calc non Af Amer: 75 mL/min/{1.73_m2} (ref >59)
Globulin, Total: 3.3 g/dL (ref 1.5–4.5)
Glucose: 101 mg/dL — ABNORMAL HIGH (ref 65–99)
Potassium: 3.9 mmol/L (ref 3.5–5.2)
Sodium: 141 mmol/L (ref 134–144)
Total Protein: 7.5 g/dL (ref 6.0–8.5)

## 2019-02-10 LAB — CBC
Hematocrit: 43 % (ref 34.0–46.6)
Hemoglobin: 14 g/dL (ref 11.1–15.9)
MCH: 29.2 pg (ref 26.6–33.0)
MCHC: 32.6 g/dL (ref 31.5–35.7)
MCV: 90 fL (ref 79–97)
Platelets: 346 10*3/uL (ref 150–450)
RBC: 4.8 x10E6/uL (ref 3.77–5.28)
RDW: 12 % (ref 11.7–15.4)
WBC: 7.6 10*3/uL (ref 3.4–10.8)

## 2019-02-10 LAB — LIPID PANEL
Chol/HDL Ratio: 5.3 ratio — ABNORMAL HIGH (ref 0.0–4.4)
Cholesterol, Total: 191 mg/dL (ref 100–199)
HDL: 36 mg/dL — ABNORMAL LOW (ref >39)
LDL Chol Calc (NIH): 127 mg/dL — ABNORMAL HIGH (ref 0–99)
Triglycerides: 156 mg/dL — ABNORMAL HIGH (ref 0–149)
VLDL Cholesterol Cal: 28 mg/dL (ref 5–40)

## 2019-02-10 LAB — HEMOGLOBIN A1C
Est. average glucose Bld gHb Est-mCnc: 105 mg/dL
Hgb A1c MFr Bld: 5.3 % (ref 4.8–5.6)

## 2019-02-10 LAB — TSH: TSH: 2.88 u[IU]/mL (ref 0.450–4.500)

## 2019-02-15 ENCOUNTER — Encounter: Payer: Self-pay | Admitting: Internal Medicine

## 2019-02-24 ENCOUNTER — Other Ambulatory Visit: Payer: Self-pay

## 2019-02-24 ENCOUNTER — Encounter (HOSPITAL_COMMUNITY)
Admission: RE | Admit: 2019-02-24 | Discharge: 2019-02-24 | Disposition: A | Discharge status: Home / Self Care | Payer: BC Managed Care – PPO | Source: Ambulatory Visit | Attending: Internal Medicine | Admitting: Internal Medicine

## 2019-02-24 DIAGNOSIS — G8929 Other chronic pain: Secondary | ICD-10-CM | POA: Diagnosis present

## 2019-02-24 DIAGNOSIS — R1011 Right upper quadrant pain: Secondary | ICD-10-CM | POA: Insufficient documentation

## 2019-02-24 MED ORDER — TECHNETIUM TC 99M MEBROFENIN IV KIT
5.2000 | PACK | Freq: Once | INTRAVENOUS | Status: AC | PRN
Start: 2019-02-24 — End: 2019-02-24
  Administered 2019-02-24: 08:00:00 5.2 via INTRAVENOUS

## 2019-02-25 ENCOUNTER — Encounter: Payer: Self-pay | Admitting: Internal Medicine

## 2019-03-01 ENCOUNTER — Encounter: Payer: Self-pay | Admitting: Internal Medicine

## 2019-03-12 ENCOUNTER — Ambulatory Visit
Admission: RE | Admit: 2019-03-12 | Discharge: 2019-03-12 | Disposition: A | Discharge status: Home / Self Care | Payer: BC Managed Care – PPO | Source: Ambulatory Visit | Attending: Internal Medicine | Admitting: Internal Medicine

## 2019-03-12 ENCOUNTER — Other Ambulatory Visit: Payer: Self-pay

## 2019-03-12 DIAGNOSIS — Z1231 Encounter for screening mammogram for malignant neoplasm of breast: Secondary | ICD-10-CM

## 2019-03-22 ENCOUNTER — Ambulatory Visit: Payer: Self-pay | Admitting: Surgery

## 2019-03-22 NOTE — H&P (View-Only) (Signed)
Dhani M Tieszen Documented: 03/22/2019 2:46 PM Location: [facility] Patient #: [id number] DOB: 06/12/1970 Single / Language: English / Race: White Female  History of Present Illness (Kemaria Dedic A. Billyjoe Go MD; 03/22/2019 3:56 PM) Patient words: Patient is sent at the request of Dr. Sanders for abdominal pain. She has had a 4 month history of epigastric abdominal pain and pressure under her right costal margin after meals. It last from 15 minutes to an hour. It happens on a weekly basis. She had a computed tomography scan which showed gallstones and a HIDA which showed no evidence of cholecystitis. Fatty foods make it worse. She does have some nausea and vomiting.                        CLINICAL DATA: Right upper quadrant pain  EXAM: NUCLEAR MEDICINE HEPATOBILIARY IMAGING WITH GALLBLADDER EF  TECHNIQUE: Sequential images of the abdomen were obtained out to 60 minutes following intravenous administration of radiopharmaceutical. After oral ingestion of Ensure, gallbladder ejection fraction was determined. At 60 min, normal ejection fraction is greater than 33%.  RADIOPHARMACEUTICALS: 5.2 mCi Tc-99m Choletec IV  COMPARISON: None.  FINDINGS: Prompt uptake and biliary excretion of activity by the liver is seen. Gallbladder activity is visualized, consistent with patency of cystic duct. Biliary activity passes into small bowel, consistent with patent common bile duct.  Calculated gallbladder ejection fraction is 65%. (Normal gallbladder ejection fraction with Ensure is greater than 33%.)  IMPRESSION: 1. Patent cystic duct without evidence for acute cholecystitis. 2. Normal gallbladder ejection fraction.   Electronically Signed By: Taylor Stroud M.D. On: 02/24/2019 13:51    On: 03/12/2019 15:37.  The patient is a 48 year old female.   Past Surgical History ([name], CMA; 03/22/2019 2:46 PM) Colon Polyp Removal -  Colonoscopy  Diagnostic Studies History ([name], CMA; 03/22/2019 2:46 PM) Colonoscopy 1-5 years ago Mammogram within last year Pap Smear >5 years ago  Allergies ([name], CMA; 03/22/2019 2:47 PM) No Known Allergies [03/22/2019]: No Known Drug Allergies [03/22/2019]: Allergies Reconciled  Medication History ([name], CMA; 03/22/2019 2:47 PM) Ondansetron (4MG Tablet Disint, Oral) Active. Medications Reconciled  Social History ([name], CMA; 03/22/2019 2:46 PM) Alcohol use Occasional alcohol use. Caffeine use Tea. No drug use Tobacco use Never smoker.  Family History ([name], CMA; 03/22/2019 2:46 PM) Alcohol Abuse Brother, Sister. Arthritis Father. Cancer Family Members In General. Colon Cancer Family Members In General. Depression Mother, Sister. Diabetes Mellitus Father. Heart Disease Brother, Father. Hypertension Father, Mother. Ovarian Cancer Family Members In General, Mother. Respiratory Condition Mother. Seizure disorder Brother, Father.  Pregnancy / Birth History ([name], CMA; 03/22/2019 2:46 PM) Age at menarche 15 years. Gravida 0 Irregular periods  Other Problems ([name], CMA; 03/22/2019 2:46 PM) Back Pain Cholelithiasis Depression Gastroesophageal Reflux Disease Hemorrhoids     Review of Systems ([name] CMA; 03/22/2019 2:46 PM) General Present- Fatigue. Not Present- Appetite Loss, Chills, Fever, Night Sweats, Weight Gain and Weight Loss. Skin Not Present- Change in Wart/Mole, Dryness, Hives, Jaundice, New Lesions, Non-Healing Wounds, Rash and Ulcer. HEENT Not Present- Earache, Hearing Loss, Hoarseness, Nose Bleed, Oral Ulcers, Ringing in the Ears, Seasonal Allergies, Sinus Pain, Sore Throat, Visual Disturbances, Wears glasses/contact lenses and Yellow Eyes. Respiratory Not Present- Bloody sputum, Chronic Cough, Difficulty Breathing, Snoring and Wheezing. Cardiovascular  Not Present- Chest Pain, Difficulty Breathing Lying Down, Leg Cramps, Palpitations, Rapid Heart Rate, Shortness of Breath and Swelling of Extremities. Gastrointestinal Present- Abdominal Pain and Nausea.   Not Present- Bloating, Bloody Stool, Change in Bowel Habits, Chronic diarrhea, Constipation, Difficulty Swallowing, Excessive gas, Gets full quickly at meals, Hemorrhoids, Indigestion, Rectal Pain and Vomiting. Female Genitourinary Not Present- Frequency, Nocturia, Painful Urination, Pelvic Pain and Urgency. Musculoskeletal Present- Back Pain. Not Present- Joint Pain, Joint Stiffness, Muscle Pain, Muscle Weakness and Swelling of Extremities. Neurological Not Present- Decreased Memory, Fainting, Headaches, Numbness, Seizures, Tingling, Tremor, Trouble walking and Weakness. Psychiatric Not Present- Anxiety, Bipolar, Change in Sleep Pattern, Depression, Fearful and Frequent crying. Endocrine Not Present- Cold Intolerance, Excessive Hunger, Hair Changes, Heat Intolerance, Hot flashes and New Diabetes. Hematology Not Present- Blood Thinners, Easy Bruising, Excessive bleeding, Gland problems, HIV and Persistent Infections.  Vitals ([name] CMA; 03/22/2019 2:48 PM) 03/22/2019 2:47 PM Weight: 263 lb Height: 66in Body Surface Area: 2.25 m Body Mass Index: 42.45 kg/m  Temp.: 67F(Temporal)  Pulse: 108 (Regular)  BP: 132/68 (Sitting, Left Arm, Standard)        Physical Exam (Kenadi Miltner A. Demarri Elie MD; 03/22/2019 3:56 PM)  General Mental Status-Alert. General Appearance-Consistent with stated age. Hydration-Well hydrated. Voice-Normal.  Head and Neck Head-normocephalic, atraumatic with no lesions or palpable masses.  Eye Eyeball - Bilateral-Extraocular movements intact. Sclera/Conjunctiva - Bilateral-No scleral icterus.  Chest and Lung Exam Chest and lung exam reveals -quiet, even and easy respiratory effort with no use of accessory muscles and on  auscultation, normal breath sounds, no adventitious sounds and normal vocal resonance. Inspection Chest Wall - Normal. Back - normal.  Cardiovascular Cardiovascular examination reveals -on palpation PMI is normal in location and amplitude, no palpable S3 or S4. Normal cardiac borders., normal heart sounds, regular rate and rhythm with no murmurs, carotid auscultation reveals no bruits and normal pedal pulses bilaterally.  Abdomen Inspection Inspection of the abdomen reveals - No Hernias. Skin - Scar - no surgical scars. Palpation/Percussion Palpation and Percussion of the abdomen reveal - Soft, Non Tender, No Rebound tenderness, No Rigidity (guarding) and No hepatosplenomegaly. Auscultation Auscultation of the abdomen reveals - Bowel sounds normal.  Neurologic Neurologic evaluation reveals -alert and oriented x 3 with no impairment of recent or remote memory. Mental Status-Normal.  Musculoskeletal Normal Exam - Left-Upper Extremity Strength Normal and Lower Extremity Strength Normal. Normal Exam - Right-Upper Extremity Strength Normal, Lower Extremity Weakness.    Assessment & Plan (Chamille Werntz A. Zalaya Astarita MD; 03/22/2019 3:58 PM)  CHRONIC CHOLECYSTITIS WITH CALCULUS (K80.10)  Current Plans You are being scheduled for surgery- Our schedulers will call you.  You should hear from our office's scheduling department within 5 working days about the location, date, and time of surgery. We try to make accommodations for patient's preferences in scheduling surgery, but sometimes the OR schedule or the surgeon's schedule prevents [place] from making those accommodations.  If you have not heard from our office [phone]) in 5 working days, call the office and ask for your surgeon's nurse.  If you have other questions about your diagnosis, plan, or surgery, call the office and ask for your surgeon's nurse.   SYMPTOMATIC CHOLELITHIASIS (K80.20) Impression: Recommend laparoscopic  cholecystectomy possible cholangiogram port sounds like some chronic cholelithiasis and probable chronic cholecystitis. Gallbladder was normal her symptoms are more likely related to her gallbladder disease. I've recommend a laparoscopic cholecystectomy to her as the best treatment option. The procedure has been discussed with the patient. Risks of laparoscopic cholecystectomy include bleeding, infection, bile duct injury, leak, death, open surgery, diarrhea, other surgery, organ injury, blood vessel injury, DVT, and additional care.  Current Plans Pt Education - Pamphlet  Given - Laparoscopic Gallbladder Surgery: discussed with patient and provided information. The anatomy & physiology of hepatobiliary & pancreatic function was discussed. The pathophysiology of gallbladder dysfunction was discussed. Natural history risks without surgery was discussed. I feel the risks of no intervention will lead to serious problems that outweigh the operative risks; therefore, I recommended cholecystectomy to remove the pathology. I explained laparoscopic techniques with possible need for an open approach. Probable cholangiogram to evaluate the bilary tract was explained as well.  Risks such as bleeding, infection, abscess, leak, injury to other organs, need for further treatment, heart attack, death, and other risks were discussed. I noted a good likelihood this will help address the problem. Possibility that this will not correct all abdominal symptoms was explained. Goals of post-operative recovery were discussed as well. We will work to minimize complications. An educational handout further explaining the pathology and treatment options was given as well. Questions were answered. The patient expresses understanding & wishes to proceed with surgery.  Pt Education - [facility] Laparosopic Post Op HCI (Gross) Pt Education - [facility] Good Bowel Health (Gross) Pt Education - Laparoscopic Cholecystectomy: gallbladder

## 2019-03-22 NOTE — H&P (Signed)
Catherine Elliott Documented: 03/22/2019 2:46 PM Location: [facility] Patient #: [id number] DOB: 07/22/1970 Single / Language: [place] / Race: White Female  History of Present Illness Catherine Elliott([name] [name] MD; 03/22/2019 3:56 PM) Patient words: Patient is sent at the request of Dr. Allyne GeeSanders for abdominal pain. She has had a 4 month history of epigastric abdominal pain and pressure under her right costal margin after meals. It last from 15 minutes to an hour. It happens on a weekly basis. She had a computed tomography scan which showed gallstones and a HIDA which showed no evidence of cholecystitis. Fatty foods make it worse. She does have some nausea and vomiting.                        CLINICAL DATA: Right upper quadrant pain  EXAM: NUCLEAR MEDICINE HEPATOBILIARY IMAGING WITH GALLBLADDER EF  TECHNIQUE: Sequential images of the abdomen were obtained out to 60 minutes following intravenous administration of radiopharmaceutical. After oral ingestion of Ensure, gallbladder ejection fraction was determined. At 60 min, normal ejection fraction is greater than 33%.  RADIOPHARMACEUTICALS: 5.2 mCi Tc-4750m Choletec IV  COMPARISON: None.  FINDINGS: Prompt uptake and biliary excretion of activity by the liver is seen. Gallbladder activity is visualized, consistent with patency of cystic duct. Biliary activity passes into small bowel, consistent with patent common bile duct.  Calculated gallbladder ejection fraction is 65%. (Normal gallbladder ejection fraction with Ensure is greater than 33%.)  IMPRESSION: 1. Patent cystic duct without evidence for acute cholecystitis. 2. Normal gallbladder ejection fraction.   Electronically Signed By: [name] M.D. On: 02/24/2019 13:51    On: 03/12/2019 15:37.  The patient is a 48 year old female.   Past Surgical History Catherine Elliott(Catherine Elliott, [place]; 03/22/2019 2:46 PM) Colon Polyp Removal -  Colonoscopy  Diagnostic Studies History Catherine Elliott(Catherine Elliott, CMA; 03/22/2019 2:46 PM) Colonoscopy 1-5 years ago Mammogram within last year Pap Smear >5 years ago  Allergies Catherine Elliott(Catherine Elliott, CMA; 03/22/2019 2:47 PM) No Known Allergies [03/22/2019]: No Known Drug Allergies [03/22/2019]: Allergies Reconciled  Medication History Catherine Elliott(Catherine Elliott, CMA; 03/22/2019 2:47 PM) Ondansetron (4MG  Tablet Disint, Oral) Active. Medications Reconciled  Social History Catherine Elliott(Catherine Elliott, CMA; 03/22/2019 2:46 PM) Alcohol use Occasional alcohol use. Caffeine use Tea. No drug use Tobacco use Never smoker.  Family History Catherine Elliott(Catherine Elliott, [place]; 03/22/2019 2:46 PM) Alcohol Abuse Brother, Sister. Arthritis Father. Cancer Family Members In General. Colon Cancer Family Members In General. Depression Mother, Sister. Diabetes Mellitus Father. Heart Disease Brother, Father. Hypertension Father, Mother. Ovarian Cancer Family Members In General, Mother. Respiratory Condition Mother. Seizure disorder Brother, Father.  Pregnancy / Birth History Catherine Elliott(Catherine Elliott, CMA; 03/22/2019 2:46 PM) Age at menarche 15 years. Gravida 0 Irregular periods  Other Problems Catherine Elliott(Catherine Elliott, CMA; 03/22/2019 2:46 PM) Back Pain Cholelithiasis Depression Gastroesophageal Reflux Disease Hemorrhoids     Review of Systems (Catherine Elliott CMA; 03/22/2019 2:46 PM) General Present- Fatigue. Not Present- Appetite Loss, Chills, Fever, Night Sweats, Weight Gain and Weight Loss. Skin Not Present- Change in Wart/Mole, Dryness, Hives, Jaundice, New Lesions, Non-Healing Wounds, Rash and Ulcer. HEENT Not Present- Earache, Hearing Loss, Hoarseness, Nose Bleed, Oral Ulcers, Ringing in the Ears, Seasonal Allergies, Sinus Pain, Sore Throat, Visual Disturbances, Wears glasses/contact lenses and Yellow Eyes. Respiratory Not Present- Bloody sputum, Chronic Cough, Difficulty Breathing, Snoring and Wheezing. Cardiovascular  Not Present- Chest Pain, Difficulty Breathing Lying Down, Leg Cramps, Palpitations, Rapid Heart Rate, Shortness of Breath and Swelling of Extremities. Gastrointestinal Present- Abdominal Pain and Nausea.  Not Present- Bloating, Bloody Stool, Change in Bowel Habits, Chronic diarrhea, Constipation, Difficulty Swallowing, Excessive gas, Gets full quickly at meals, Hemorrhoids, Indigestion, Rectal Pain and Vomiting. Female Genitourinary Not Present- Frequency, Nocturia, Painful Urination, Pelvic Pain and Urgency. Musculoskeletal Present- Back Pain. Not Present- Joint Pain, Joint Stiffness, Muscle Pain, Muscle Weakness and Swelling of Extremities. Neurological Not Present- Decreased Memory, Fainting, Headaches, Numbness, Seizures, Tingling, Tremor, Trouble walking and Weakness. Psychiatric Not Present- Anxiety, Bipolar, Change in Sleep Pattern, Depression, Fearful and Frequent crying. Endocrine Not Present- Cold Intolerance, Excessive Hunger, Hair Changes, Heat Intolerance, Hot flashes and New Diabetes. Hematology Not Present- Blood Thinners, Easy Bruising, Excessive bleeding, Gland problems, HIV and Persistent Infections.  Vitals (Catherine Elliott CMA; 03/22/2019 2:48 PM) 03/22/2019 2:47 PM Weight: 263 lb Height: 66in Body Surface Area: 2.25 m Body Mass Index: 42.45 kg/m  Temp.: 67F(Temporal)  Pulse: 108 (Regular)  BP: 132/68 (Sitting, Left Arm, Standard)        Physical Exam (Aniston Christman A. Raphaella Larkin MD; 03/22/2019 3:56 PM)  General Mental Status-Alert. General Appearance-Consistent with stated age. Hydration-Well hydrated. Voice-Normal.  Head and Neck Head-normocephalic, atraumatic with no lesions or palpable masses.  Eye Eyeball - Bilateral-Extraocular movements intact. Sclera/Conjunctiva - Bilateral-No scleral icterus.  Chest and Lung Exam Chest and lung exam reveals -quiet, even and easy respiratory effort with no use of accessory muscles and on  auscultation, normal breath sounds, no adventitious sounds and normal vocal resonance. Inspection Chest Wall - Normal. Back - normal.  Cardiovascular Cardiovascular examination reveals -on palpation PMI is normal in location and amplitude, no palpable S3 or S4. Normal cardiac borders., normal heart sounds, regular rate and rhythm with no murmurs, carotid auscultation reveals no bruits and normal pedal pulses bilaterally.  Abdomen Inspection Inspection of the abdomen reveals - No Hernias. Skin - Scar - no surgical scars. Palpation/Percussion Palpation and Percussion of the abdomen reveal - Soft, Non Tender, No Rebound tenderness, No Rigidity (guarding) and No hepatosplenomegaly. Auscultation Auscultation of the abdomen reveals - Bowel sounds normal.  Neurologic Neurologic evaluation reveals -alert and oriented x 3 with no impairment of recent or remote memory. Mental Status-Normal.  Musculoskeletal Normal Exam - Left-Upper Extremity Strength Normal and Lower Extremity Strength Normal. Normal Exam - Right-Upper Extremity Strength Normal, Lower Extremity Weakness.    Assessment & Plan (Hymie Gorr A. Kortlynn Poust MD; 03/22/2019 3:58 PM)  CHRONIC CHOLECYSTITIS WITH CALCULUS (K80.10)  Current Plans You are being scheduled for surgery- Our schedulers will call you.  You should hear from our office's scheduling department within 5 working days about the location, date, and time of surgery. We try to make accommodations for patient's preferences in scheduling surgery, but sometimes the OR schedule or the surgeon's schedule prevents [place] from making those accommodations.  If you have not heard from our office [phone]) in 5 working days, call the office and ask for your surgeon's nurse.  If you have other questions about your diagnosis, plan, or surgery, call the office and ask for your surgeon's nurse.   SYMPTOMATIC CHOLELITHIASIS (K80.20) Impression: Recommend laparoscopic  cholecystectomy possible cholangiogram port sounds like some chronic cholelithiasis and probable chronic cholecystitis. Gallbladder was normal her symptoms are more likely related to her gallbladder disease. I've recommend a laparoscopic cholecystectomy to her as the best treatment option. The procedure has been discussed with the patient. Risks of laparoscopic cholecystectomy include bleeding, infection, bile duct injury, leak, death, open surgery, diarrhea, other surgery, organ injury, blood vessel injury, DVT, and additional care.  Current Plans Pt Education - Pamphlet  Given - Laparoscopic Gallbladder Surgery: discussed with patient and provided information. The anatomy & physiology of hepatobiliary & pancreatic function was discussed. The pathophysiology of gallbladder dysfunction was discussed. Natural history risks without surgery was discussed. I feel the risks of no intervention will lead to serious problems that outweigh the operative risks; therefore, I recommended cholecystectomy to remove the pathology. I explained laparoscopic techniques with possible need for an open approach. Probable cholangiogram to evaluate the bilary tract was explained as well.  Risks such as bleeding, infection, abscess, leak, injury to other organs, need for further treatment, heart attack, death, and other risks were discussed. I noted a good likelihood this will help address the problem. Possibility that this will not correct all abdominal symptoms was explained. Goals of post-operative recovery were discussed as well. We will work to minimize complications. An educational handout further explaining the pathology and treatment options was given as well. Questions were answered. The patient expresses understanding & wishes to proceed with surgery.  Pt Education - [facility] Laparosopic Post Op HCI (Gross) Pt Education - [facility] Good Bowel Health (Gross) Pt Education - Laparoscopic Cholecystectomy: gallbladder

## 2019-03-23 ENCOUNTER — Encounter: Payer: Self-pay | Admitting: Internal Medicine

## 2019-04-13 NOTE — Pre-Procedure Instructions (Signed)
[id number] Catherine Elliott, [place] - [address] [place] [address] Phone: [phone] Fax: [phone]     Your procedure is scheduled on Tuesday November 17th.  Report to [facility] Main Entrance "A" at 5:30 A.M., and check in at the Admitting office.  Call this number if you have problems the morning of surgery:  [phone]  Call [phone] if you have any questions prior to your surgery date Monday-Friday 8am-4pm    Remember:  Do not eat or drink after midnight the night before your surgery     Take these medicines the morning of surgery with A SIP OF WATER  omeprazole (PRILOSEC)   7 days prior to surgery STOP taking any Aspirin (unless otherwise instructed by your surgeon), Aleve, Naproxen, Ibuprofen, Motrin, Advil, Goody's, BC's, all herbal medications, fish oil, and all vitamins.    The Morning of Surgery  Do not wear jewelry, make-up or nail polish.  Do not wear lotions, powders, or perfumes/colognes, or deodorant  Do not shave 48 hours prior to surgery.  Men may shave face and neck.  Do not bring valuables to the hospital.  [facility] is not responsible for any belongings or valuables.  If you are a smoker, DO NOT Smoke 24 hours prior to surgery IF you wear a CPAP at night please bring your mask, tubing, and machine the morning of surgery   Remember that you must have someone to transport you home after your surgery, and remain with you for 24 hours if you are discharged the same day.   Contacts, glasses, hearing aids, dentures or bridgework may not be worn into surgery.    Leave your suitcase in the car.  After surgery it may be brought to your room.  For patients admitted to the hospital, discharge time will be determined by your treatment team.  Patients discharged the day of surgery will not be allowed to drive home.    Special instructions:   [facility]- Preparing For Surgery  Before surgery, you can play  an important role. Because skin is not sterile, your skin needs to be as free of germs as possible. You can reduce the number of germs on your skin by washing with CHG (chlorahexidine gluconate) Soap before surgery.  CHG is an antiseptic cleaner which kills germs and bonds with the skin to continue killing germs even after washing.    Oral Hygiene is also important to reduce your risk of infection.  Remember - BRUSH YOUR TEETH THE MORNING OF SURGERY WITH YOUR REGULAR TOOTHPASTE  Please do not use if you have an allergy to CHG or antibacterial soaps. If your skin becomes reddened/irritated stop using the CHG.  Do not shave (including legs and underarms) for at least 48 hours prior to first CHG shower. It is OK to shave your face.  Please follow these instructions carefully.   1. Shower the NIGHT BEFORE SURGERY and the MORNING OF SURGERY with CHG Soap.   2. If you chose to wash your hair, wash your hair first as usual with your normal shampoo.  3. After you shampoo, rinse your hair and body thoroughly to remove the shampoo.  4. Use CHG as you would any other liquid soap. You can apply CHG directly to the skin and wash gently with a scrungie or a clean washcloth.   5. Apply the CHG Soap to your body ONLY FROM THE NECK DOWN.  Do not use on open wounds or open  sores. Avoid contact with your eyes, ears, mouth and genitals (private parts). Wash Face and genitals (private parts)  with your normal soap.   6. Wash thoroughly, paying special attention to the area where your surgery will be performed.  7. Thoroughly rinse your body with warm water from the neck down.  8. DO NOT shower/wash with your normal soap after using and rinsing off the CHG Soap.  9. Pat yourself dry with a CLEAN TOWEL.  10. Wear CLEAN PAJAMAS to bed the night before surgery, wear comfortable clothes the morning of surgery  11. Place CLEAN SHEETS on your bed the night of your first shower and DO NOT SLEEP WITH PETS.    Day  of Surgery:  Do not apply any deodorants/lotions. Please shower the morning of surgery with the CHG soap  Please wear clean clothes to the hospital/surgery center.   Remember to brush your teeth WITH YOUR REGULAR TOOTHPASTE.   Please read over the following fact sheets that you were given.

## 2019-04-14 ENCOUNTER — Encounter (HOSPITAL_COMMUNITY)
Admission: RE | Admit: 2019-04-14 | Discharge: 2019-04-14 | Disposition: A | Discharge status: Home / Self Care | Payer: BC Managed Care – PPO | Source: Ambulatory Visit | Attending: Surgery | Admitting: Surgery

## 2019-04-14 ENCOUNTER — Encounter (HOSPITAL_COMMUNITY): Payer: Self-pay

## 2019-04-14 ENCOUNTER — Other Ambulatory Visit: Payer: Self-pay

## 2019-04-14 DIAGNOSIS — Z01812 Encounter for preprocedural laboratory examination: Secondary | ICD-10-CM | POA: Insufficient documentation

## 2019-04-14 LAB — CBC WITH DIFFERENTIAL/PLATELET
Abs Immature Granulocytes: 0.02 10*3/uL (ref 0.00–0.07)
Basophils Absolute: 0.1 10*3/uL (ref 0.0–0.1)
Basophils Relative: 1 %
Eosinophils Absolute: 0.1 10*3/uL (ref 0.0–0.5)
Eosinophils Relative: 2 %
HCT: 44 % (ref 36.0–46.0)
Hemoglobin: 14.4 g/dL (ref 12.0–15.0)
Immature Granulocytes: 0 %
Lymphocytes Relative: 28 %
Lymphs Abs: 2 10*3/uL (ref 0.7–4.0)
MCH: 29.8 pg (ref 26.0–34.0)
MCHC: 32.7 g/dL (ref 30.0–36.0)
MCV: 91.1 fL (ref 80.0–100.0)
Monocytes Absolute: 0.5 10*3/uL (ref 0.1–1.0)
Monocytes Relative: 8 %
Neutro Abs: 4.4 10*3/uL (ref 1.7–7.7)
Neutrophils Relative %: 61 %
Platelets: 270 10*3/uL (ref 150–400)
RBC: 4.83 MIL/uL (ref 3.87–5.11)
RDW: 13.4 % (ref 11.5–15.5)
WBC: 7.1 10*3/uL (ref 4.0–10.5)
nRBC: 0 % (ref 0.0–0.2)

## 2019-04-14 LAB — COMPREHENSIVE METABOLIC PANEL
ALT: 86 U/L — ABNORMAL HIGH (ref 0–44)
AST: 104 U/L — ABNORMAL HIGH (ref 15–41)
Albumin: 3.5 g/dL (ref 3.5–5.0)
Alkaline Phosphatase: 119 U/L (ref 38–126)
Anion gap: 9 (ref 5–15)
BUN: 6 mg/dL (ref 6–20)
CO2: 23 mmol/L (ref 22–32)
Calcium: 9.4 mg/dL (ref 8.9–10.3)
Chloride: 108 mmol/L (ref 98–111)
Creatinine, Ser: 0.93 mg/dL (ref 0.44–1.00)
GFR calc Af Amer: >60 mL/min (ref >60)
GFR calc non Af Amer: >60 mL/min (ref >60)
Glucose, Bld: 105 mg/dL — ABNORMAL HIGH (ref 70–99)
Potassium: 3.9 mmol/L (ref 3.5–5.1)
Sodium: 140 mmol/L (ref 135–145)
Total Bilirubin: 1 mg/dL (ref 0.3–1.2)
Total Protein: 7.4 g/dL (ref 6.5–8.1)

## 2019-04-14 NOTE — Progress Notes (Signed)
PCP - Glendale Chard Cardiologist - denies  Chest x-ray - n/a EKG - n/a Stress Test - debnies ECHO - denies Cardiac Cath - denies  COVID TEST- 04/16/19 gave instructions on location   Anesthesia review: No  Patient denies shortness of breath, fever, cough and chest pain at PAT appointment   All instructions explained to the patient, with a verbal understanding of the material. Patient agrees to go over the instructions while at home for a better understanding. Patient also instructed to self quarantine after being tested for COVID-19. The opportunity to ask questions was provided.

## 2019-04-16 ENCOUNTER — Other Ambulatory Visit (HOSPITAL_COMMUNITY)
Admission: RE | Admit: 2019-04-16 | Discharge: 2019-04-16 | Disposition: A | Discharge status: Home / Self Care | Payer: BC Managed Care – PPO | Source: Ambulatory Visit | Attending: Surgery | Admitting: Surgery

## 2019-04-16 DIAGNOSIS — Z20828 Contact with and (suspected) exposure to other viral communicable diseases: Secondary | ICD-10-CM | POA: Insufficient documentation

## 2019-04-16 DIAGNOSIS — Z01812 Encounter for preprocedural laboratory examination: Secondary | ICD-10-CM | POA: Insufficient documentation

## 2019-04-18 LAB — NOVEL CORONAVIRUS, NAA (HOSP ORDER, SEND-OUT TO REF LAB; TAT 18-24 HRS): SARS-CoV-2, NAA: NOT DETECTED

## 2019-04-19 MED ORDER — DEXTROSE 5 % IV SOLN
3.0000 g | INTRAVENOUS | Status: AC
  Administered 2019-04-20: 3 g via INTRAVENOUS
  Filled 2019-04-19: qty 3000
  Filled 2019-04-19: qty 3

## 2019-04-19 NOTE — Anesthesia Preprocedure Evaluation (Addendum)
Anesthesia Evaluation  Patient identified by MRN, date of birth, ID band Patient awake    Reviewed: Allergy & Precautions, NPO status , Patient's Chart, lab work & pertinent test results  History of Anesthesia Complications Negative for: history of anesthetic complications  Airway Mallampati: II  TM Distance: >3 FB Neck ROM: Full    Dental  (+) Dental Advisory Given, Teeth Intact   Pulmonary neg pulmonary ROS,    Pulmonary exam normal        Cardiovascular negative cardio ROS Normal cardiovascular exam     Neuro/Psych  Headaches, PSYCHIATRIC DISORDERS Depression    GI/Hepatic Neg liver ROS, GERD  Medicated and Controlled, UC    Endo/Other  Morbid obesity  Renal/GU negative Renal ROS     Musculoskeletal negative musculoskeletal ROS (+)   Abdominal (+) + obese,   Peds  Hematology negative hematology ROS (+)   Anesthesia Other Findings   Reproductive/Obstetrics                            Anesthesia Physical Anesthesia Plan  ASA: III  Anesthesia Plan: General   Post-op Pain Management:    Induction: Intravenous  PONV Risk Score and Plan: 4 or greater and Treatment may vary due to age or medical condition, Ondansetron, Scopolamine patch - Pre-op, Midazolam and Dexamethasone  Airway Management Planned: Oral ETT  Additional Equipment: None  Intra-op Plan:   Post-operative Plan: Extubation in OR  Informed Consent: I have reviewed the patients History and Physical, chart, labs and discussed the procedure including the risks, benefits and alternatives for the proposed anesthesia with the patient or authorized representative who has indicated his/her understanding and acceptance.     Dental advisory given  Plan Discussed with: CRNA and Anesthesiologist  Anesthesia Plan Comments:        Anesthesia Quick Evaluation

## 2019-04-20 ENCOUNTER — Encounter (HOSPITAL_COMMUNITY): Payer: Self-pay

## 2019-04-20 ENCOUNTER — Other Ambulatory Visit: Payer: Self-pay

## 2019-04-20 ENCOUNTER — Ambulatory Visit (HOSPITAL_COMMUNITY): Payer: BC Managed Care – PPO | Admitting: Anesthesiology

## 2019-04-20 ENCOUNTER — Encounter (HOSPITAL_COMMUNITY)
Admission: RE | Disposition: A | Discharge status: Home / Self Care | Payer: Self-pay | Source: Home / Self Care | Attending: Surgery

## 2019-04-20 ENCOUNTER — Ambulatory Visit (HOSPITAL_COMMUNITY)
Admission: RE | Admit: 2019-04-20 | Discharge: 2019-04-20 | Disposition: A | Discharge status: Home / Self Care | Payer: BC Managed Care – PPO | Attending: Surgery | Admitting: Surgery

## 2019-04-20 DIAGNOSIS — K8020 Calculus of gallbladder without cholecystitis without obstruction: Secondary | ICD-10-CM | POA: Diagnosis present

## 2019-04-20 DIAGNOSIS — K219 Gastro-esophageal reflux disease without esophagitis: Secondary | ICD-10-CM | POA: Insufficient documentation

## 2019-04-20 DIAGNOSIS — K8012 Calculus of gallbladder with acute and chronic cholecystitis without obstruction: Secondary | ICD-10-CM | POA: Insufficient documentation

## 2019-04-20 DIAGNOSIS — Z6841 Body Mass Index (BMI) 40.0 and over, adult: Secondary | ICD-10-CM | POA: Diagnosis not present

## 2019-04-20 HISTORY — PX: CHOLECYSTECTOMY: SHX55

## 2019-04-20 LAB — POCT PREGNANCY, URINE: Preg Test, Ur: NEGATIVE

## 2019-04-20 SURGERY — LAPAROSCOPIC CHOLECYSTECTOMY
Anesthesia: General | Site: Abdomen

## 2019-04-20 MED ORDER — CHLORHEXIDINE GLUCONATE CLOTH 2 % EX PADS: 6.0000 | MEDICATED_PAD | Freq: Once | CUTANEOUS | Status: DC

## 2019-04-20 MED ORDER — LIDOCAINE 2% (20 MG/ML) 5 ML SYRINGE
INTRAMUSCULAR | Status: AC
  Filled 2019-04-20: qty 5

## 2019-04-20 MED ORDER — GABAPENTIN 300 MG PO CAPS
300.0000 mg | ORAL_CAPSULE | ORAL | Status: AC
  Administered 2019-04-20: 06:00:00 300 mg via ORAL

## 2019-04-20 MED ORDER — PROPOFOL 10 MG/ML IV BOLUS
INTRAVENOUS | Status: DC | PRN
  Administered 2019-04-20: 200 mg via INTRAVENOUS

## 2019-04-20 MED ORDER — CELECOXIB 200 MG PO CAPS
ORAL_CAPSULE | ORAL | Status: AC
  Administered 2019-04-20: 200 mg via ORAL
  Filled 2019-04-20: qty 1

## 2019-04-20 MED ORDER — OXYCODONE HCL 5 MG PO TABS: 5.0000 mg | ORAL_TABLET | Freq: Four times a day (QID) | ORAL | 0 refills | Status: DC | PRN

## 2019-04-20 MED ORDER — OXYCODONE HCL 5 MG PO TABS
ORAL_TABLET | ORAL | Status: AC
  Filled 2019-04-20: qty 1

## 2019-04-20 MED ORDER — ACETAMINOPHEN 500 MG PO TABS
ORAL_TABLET | ORAL | Status: AC
  Administered 2019-04-20: 1000 mg via ORAL
  Filled 2019-04-20: qty 2

## 2019-04-20 MED ORDER — PHENYLEPHRINE 40 MCG/ML (10ML) SYRINGE FOR IV PUSH (FOR BLOOD PRESSURE SUPPORT)
PREFILLED_SYRINGE | INTRAVENOUS | Status: DC | PRN
  Administered 2019-04-20: 80 ug via INTRAVENOUS

## 2019-04-20 MED ORDER — FENTANYL CITRATE (PF) 250 MCG/5ML IJ SOLN
INTRAMUSCULAR | Status: AC
  Filled 2019-04-20: qty 5

## 2019-04-20 MED ORDER — ONDANSETRON HCL 4 MG/2ML IJ SOLN
INTRAMUSCULAR | Status: AC
  Filled 2019-04-20: qty 2

## 2019-04-20 MED ORDER — BUPIVACAINE-EPINEPHRINE 0.5% -1:200000 IJ SOLN
INTRAMUSCULAR | Status: AC
  Filled 2019-04-20: qty 1

## 2019-04-20 MED ORDER — 0.9 % SODIUM CHLORIDE (POUR BTL) OPTIME
TOPICAL | Status: DC | PRN
  Administered 2019-04-20: 1000 mL

## 2019-04-20 MED ORDER — OXYCODONE HCL 5 MG PO TABS
5.0000 mg | ORAL_TABLET | Freq: Once | ORAL | Status: AC | PRN
  Administered 2019-04-20: 5 mg via ORAL

## 2019-04-20 MED ORDER — ROCURONIUM BROMIDE 10 MG/ML (PF) SYRINGE
PREFILLED_SYRINGE | INTRAVENOUS | Status: DC | PRN
  Administered 2019-04-20: 60 mg via INTRAVENOUS

## 2019-04-20 MED ORDER — SCOPOLAMINE 1 MG/3DAYS TD PT72
MEDICATED_PATCH | TRANSDERMAL | Status: DC | PRN
  Administered 2019-04-20: 1 via TRANSDERMAL

## 2019-04-20 MED ORDER — OXYCODONE HCL 5 MG/5ML PO SOLN: 5.0000 mg | Freq: Once | ORAL | Status: AC | PRN

## 2019-04-20 MED ORDER — FENTANYL CITRATE (PF) 100 MCG/2ML IJ SOLN: 25.0000 ug | INTRAMUSCULAR | Status: DC | PRN

## 2019-04-20 MED ORDER — PROPOFOL 10 MG/ML IV BOLUS
INTRAVENOUS | Status: AC
  Filled 2019-04-20: qty 20

## 2019-04-20 MED ORDER — FENTANYL CITRATE (PF) 100 MCG/2ML IJ SOLN
INTRAMUSCULAR | Status: DC | PRN
  Administered 2019-04-20 (×3): 50 ug via INTRAVENOUS
  Administered 2019-04-20: 100 ug via INTRAVENOUS

## 2019-04-20 MED ORDER — ONDANSETRON HCL 4 MG/2ML IJ SOLN
INTRAMUSCULAR | Status: DC | PRN
  Administered 2019-04-20: 4 mg via INTRAVENOUS

## 2019-04-20 MED ORDER — MIDAZOLAM HCL 5 MG/5ML IJ SOLN
INTRAMUSCULAR | Status: DC | PRN
  Administered 2019-04-20: 2 mg via INTRAVENOUS

## 2019-04-20 MED ORDER — BUPIVACAINE-EPINEPHRINE 0.5% -1:200000 IJ SOLN
INTRAMUSCULAR | Status: DC | PRN
  Administered 2019-04-20: 10 mL

## 2019-04-20 MED ORDER — GABAPENTIN 300 MG PO CAPS
ORAL_CAPSULE | ORAL | Status: AC
  Administered 2019-04-20: 300 mg via ORAL
  Filled 2019-04-20: qty 1

## 2019-04-20 MED ORDER — PROMETHAZINE HCL 25 MG/ML IJ SOLN: 6.2500 mg | INTRAMUSCULAR | Status: DC | PRN

## 2019-04-20 MED ORDER — SODIUM CHLORIDE 0.9 % IR SOLN
Status: DC | PRN
  Administered 2019-04-20: 1000 mL

## 2019-04-20 MED ORDER — ROCURONIUM BROMIDE 10 MG/ML (PF) SYRINGE
PREFILLED_SYRINGE | INTRAVENOUS | Status: AC
  Filled 2019-04-20: qty 10

## 2019-04-20 MED ORDER — DEXAMETHASONE SODIUM PHOSPHATE 10 MG/ML IJ SOLN
INTRAMUSCULAR | Status: AC
  Filled 2019-04-20: qty 1

## 2019-04-20 MED ORDER — SUGAMMADEX SODIUM 200 MG/2ML IV SOLN
INTRAVENOUS | Status: DC | PRN
  Administered 2019-04-20: 200 mg via INTRAVENOUS

## 2019-04-20 MED ORDER — MIDAZOLAM HCL 2 MG/2ML IJ SOLN
INTRAMUSCULAR | Status: AC
  Filled 2019-04-20: qty 2

## 2019-04-20 MED ORDER — LIDOCAINE 2% (20 MG/ML) 5 ML SYRINGE
INTRAMUSCULAR | Status: DC | PRN
  Administered 2019-04-20: 100 mg via INTRAVENOUS

## 2019-04-20 MED ORDER — CELECOXIB 200 MG PO CAPS
200.0000 mg | ORAL_CAPSULE | ORAL | Status: AC
  Administered 2019-04-20: 06:00:00 200 mg via ORAL

## 2019-04-20 MED ORDER — SCOPOLAMINE 1 MG/3DAYS TD PT72
MEDICATED_PATCH | TRANSDERMAL | Status: AC
  Filled 2019-04-20: qty 1

## 2019-04-20 MED ORDER — ACETAMINOPHEN 500 MG PO TABS
1000.0000 mg | ORAL_TABLET | ORAL | Status: AC
  Administered 2019-04-20: 06:00:00 1000 mg via ORAL

## 2019-04-20 MED ORDER — IBUPROFEN 800 MG PO TABS: 800.0000 mg | ORAL_TABLET | Freq: Three times a day (TID) | ORAL | 0 refills | Status: DC | PRN

## 2019-04-20 MED ORDER — LACTATED RINGERS IV SOLN
INTRAVENOUS | Status: DC | PRN
  Administered 2019-04-20: 07:00:00 via INTRAVENOUS

## 2019-04-20 SURGICAL SUPPLY — 43 items
ADH SKN CLS APL DERMABOND .7 (GAUZE/BANDAGES/DRESSINGS) ×2
APL PRP STRL LF DISP 70% ISPRP (MISCELLANEOUS) ×1
APPLIER CLIP ROT 10 11.4 M/L (STAPLE) ×3
APR CLP MED LRG 11.4X10 (STAPLE) ×1
BAG SPEC RTRVL 10 TROC 200 (ENDOMECHANICALS) ×2
BLADE CLIPPER SURG (BLADE) IMPLANT
CANISTER SUCT 3000ML PPV (MISCELLANEOUS) ×3 IMPLANT
CHLORAPREP W/TINT 26 (MISCELLANEOUS) ×3 IMPLANT
CLIP APPLIE ROT 10 11.4 M/L (STAPLE) ×2 IMPLANT
COVER MAYO STAND STRL (DRAPES) ×3 IMPLANT
COVER SURGICAL LIGHT HANDLE (MISCELLANEOUS) ×3 IMPLANT
COVER WAND RF STERILE (DRAPES) ×3 IMPLANT
DERMABOND ADVANCED (GAUZE/BANDAGES/DRESSINGS) ×1
DERMABOND ADVANCED .7 DNX12 (GAUZE/BANDAGES/DRESSINGS) ×2 IMPLANT
DRAPE C-ARM 42X120 X-RAY (DRAPES) ×3 IMPLANT
ELECT REM PT RETURN 9FT ADLT (ELECTROSURGICAL) ×3
ELECTRODE REM PT RTRN 9FT ADLT (ELECTROSURGICAL) ×2 IMPLANT
GLOVE BIO SURGEON STRL SZ8 (GLOVE) ×3 IMPLANT
GLOVE BIOGEL PI IND STRL 8 (GLOVE) ×2 IMPLANT
GLOVE BIOGEL PI INDICATOR 8 (GLOVE) ×1
GOWN STRL REUS W/ TWL LRG LVL3 (GOWN DISPOSABLE) ×4 IMPLANT
GOWN STRL REUS W/ TWL XL LVL3 (GOWN DISPOSABLE) ×2 IMPLANT
GOWN STRL REUS W/TWL LRG LVL3 (GOWN DISPOSABLE) ×6
GOWN STRL REUS W/TWL XL LVL3 (GOWN DISPOSABLE) ×3
KIT BASIN OR (CUSTOM PROCEDURE TRAY) ×3 IMPLANT
KIT TURNOVER KIT B (KITS) ×3 IMPLANT
NS IRRIG 1000ML POUR BTL (IV SOLUTION) ×3 IMPLANT
PAD ARMBOARD 7.5X6 YLW CONV (MISCELLANEOUS) ×3 IMPLANT
POUCH RETRIEVAL ECOSAC 10 (ENDOMECHANICALS) ×2 IMPLANT
POUCH RETRIEVAL ECOSAC 10MM (ENDOMECHANICALS) ×1
SCISSORS LAP 5X35 DISP (ENDOMECHANICALS) ×3 IMPLANT
SET IRRIG TUBING LAPAROSCOPIC (IRRIGATION / IRRIGATOR) ×3 IMPLANT
SET TUBE SMOKE EVAC HIGH FLOW (TUBING) ×3 IMPLANT
SLEEVE ENDOPATH XCEL 5M (ENDOMECHANICALS) ×3 IMPLANT
SPECIMEN JAR SMALL (MISCELLANEOUS) ×3 IMPLANT
SUT MNCRL AB 4-0 PS2 18 (SUTURE) ×3 IMPLANT
TOWEL GREEN STERILE (TOWEL DISPOSABLE) ×3 IMPLANT
TOWEL GREEN STERILE FF (TOWEL DISPOSABLE) ×3 IMPLANT
TRAY LAPAROSCOPIC MC (CUSTOM PROCEDURE TRAY) ×3 IMPLANT
TROCAR XCEL BLUNT TIP 100MML (ENDOMECHANICALS) ×3 IMPLANT
TROCAR XCEL NON-BLD 11X100MML (ENDOMECHANICALS) ×3 IMPLANT
TROCAR XCEL NON-BLD 5MMX100MML (ENDOMECHANICALS) ×3 IMPLANT
WATER STERILE IRR 1000ML POUR (IV SOLUTION) ×3 IMPLANT

## 2019-04-20 NOTE — Anesthesia Procedure Notes (Signed)
Procedure Name: Intubation Date/Time: 04/20/2019 7:31 AM Performed by: Kyung Rudd, CRNA Pre-anesthesia Checklist: Patient identified, Emergency Drugs available, Suction available and Patient being monitored Patient Re-evaluated:Patient Re-evaluated prior to induction Oxygen Delivery Method: Circle system utilized Preoxygenation: Pre-oxygenation with 100% oxygen Induction Type: IV induction Ventilation: Mask ventilation without difficulty Laryngoscope Size: Mac and 3 Grade View: Grade III Tube type: Oral Tube size: 7.0 mm Number of attempts: 1 Airway Equipment and Method: Stylet Placement Confirmation: positive ETCO2 and breath sounds checked- equal and bilateral Secured at: 21 cm Tube secured with: Tape Dental Injury: Teeth and Oropharynx as per pre-operative assessment

## 2019-04-20 NOTE — Op Note (Signed)
Laparoscopic Cholecystectomy  Procedure Note  Indications: This patient presents with symptomatic gallbladder disease and will undergo laparoscopic cholecystectomy.The procedure has been discussed with the patient. Operative and non operative treatments have been discussed. Risks of surgery include bleeding, infection,  Common bile duct injury,  Injury to the stomach,liver, colon,small intestine, abdominal wall,  Diaphragm,  Major blood vessels,  And the need for an open procedure.  Other risks include worsening of medical problems, death,  DVT and pulmonary embolism, and cardiovascular events.   Medical options have also been discussed. The patient has been informed of long term expectations of surgery and non surgical options,  The patient agrees to proceed.    Pre-operative Diagnosis: Calculus of gallbladder without mention of cholecystitis or obstruction  Post-operative Diagnosis: Same  Surgeon: Turner Daniels  MD  Assistants: Deon Pilling RNFA   Anesthesia: General endotracheal anesthesia and Local anesthesia 0.5% bupivacaine  ASA Class: 2  Procedure Details  The patient was seen again in the Holding Room. The risks, benefits, complications, treatment options, and expected outcomes were discussed with the patient. The possibilities of reaction to medication, pulmonary aspiration, perforation of viscus, bleeding, recurrent infection, finding a normal gallbladder, the need for additional procedures, failure to diagnose a condition, the possible need to convert to an open procedure, and creating a complication requiring transfusion or operation were discussed with the patient. The patient and/or family concurred with the proposed plan, giving informed consent. The site of surgery properly noted/marked. The patient was taken to Operating Room, identified as Catherine Elliott and the procedure verified as Laparoscopic Cholecystectomy with Intraoperative Cholangiograms. A Time Out was held and the above  information confirmed.  Prior to the induction of general anesthesia, antibiotic prophylaxis was administered. General endotracheal anesthesia was then administered and tolerated well. After the induction, the abdomen was prepped in the usual sterile fashion. The patient was positioned in the supine position with the left arm comfortably tucked, along with some reverse Trendelenburg.  Local anesthetic agent was injected into the skin near the umbilicus and an incision made. The midline fascia was incised and the Hasson technique was used to introduce a 12 mm port under direct vision. It was secured with a figure of eight Vicryl suture placed in the usual fashion. Pneumoperitoneum was then created with CO2 and tolerated well without any adverse changes in the patient's vital signs. Additional trocars were introduced under direct vision with an 11 mm trocar in the epigastrium and 2 5 mm trocars in the right upper quadrant. All skin incisions were infiltrated with a local anesthetic agent before making the incision and placing the trocars.   The gallbladder was identified, the fundus grasped and retracted cephalad. Adhesions were lysed bluntly and with the electrocautery where indicated, taking care not to injure any adjacent organs or viscus. The infundibulum was grasped and retracted laterally, exposing the peritoneum overlying the triangle of Calot. This was then divided and exposed in a blunt fashion. The cystic duct was clearly identified and bluntly dissected circumferentially. The junctions of the gallbladder, cystic duct and common bile duct were clearly identified prior to the division of any linear structure.   The cystic duct was small and the critical view was obtained.  In light of these finding, a cholangiogram was not performed.    The cystic duct was then  ligated with surgical clips  on the patient side and  clipped on the gallbladder side and divided. The cystic artery was identified,  dissected free, ligated with  clips and divided as well. Posterior cystic artery clipped and divided.  The gallbladder was dissected from the liver bed in retrograde fashion with the electrocautery. The gallbladder was removed. The liver bed was irrigated and inspected. Hemostasis was achieved with the electrocautery. Copious irrigation was utilized and was repeatedly aspirated until clear all particulate matter. Hemostasis was achieved with no signs  Of bleeding or bile leakage.  Pneumoperitoneum was completely reduced after viewing removal of the trocars under direct vision. The wound was thoroughly irrigated and the fascia was then closed with a figure of eight suture; the skin was then closed with 4 O monocryl  and a sterile dressing was applied.  Instrument, sponge, and needle counts were correct at closure and at the conclusion of the case.   Findings:  Cholelithiasis  Estimated Blood Loss: Minimal         Drains: none          Total IV Fluids: per record          Specimens: Gallbladder           Complications: None; patient tolerated the procedure well.         Disposition: PACU - hemodynamically stable.         Condition: stable

## 2019-04-20 NOTE — Discharge Instructions (Signed)
[facility] ______[facility] °LAPAROSCOPIC SURGERY: POST OP INSTRUCTIONS °Always review your discharge instruction sheet given to you by the facility where your surgery was performed. °IF YOU HAVE DISABILITY OR FAMILY LEAVE FORMS, YOU MUST BRING THEM TO THE OFFICE FOR PROCESSING.   °DO NOT GIVE THEM TO YOUR DOCTOR. ° °1. A prescription for pain medication may be given to you upon discharge.  Take your pain medication as prescribed, if needed.  If narcotic pain medicine is not needed, then you may take acetaminophen (Tylenol) or ibuprofen (Advil) as needed. °2. Take your usually prescribed medications unless otherwise directed. °3. If you need a refill on your pain medication, please contact your pharmacy.  They will contact our office to request authorization. Prescriptions will not be filled after 5pm or on week-ends. °4. You should follow a light diet the first few days after arrival home, such as soup and crackers, etc.  Be sure to include lots of fluids daily. °5. Most patients will experience some swelling and bruising in the area of the incisions.  Ice packs will help.  Swelling and bruising can take several days to resolve.  °6. It is common to experience some constipation if taking pain medication after surgery.  Increasing fluid intake and taking a stool softener (such as Colace) will usually help or prevent this problem from occurring.  A mild laxative (Milk of Magnesia or Miralax) should be taken according to package instructions if there are no bowel movements after 48 hours. °7. Unless discharge instructions indicate otherwise, you may remove your bandages 24-48 hours after surgery, and you may shower at that time.  You may have steri-strips (small skin tapes) in place directly over the incision.  These strips should be left on the skin for 7-10 days.  If your surgeon used skin glue on the incision, you may shower in 24 hours.  The glue will flake off over the next 2-3 weeks.  Any sutures or  staples will be removed at the office during your follow-up visit. °8. ACTIVITIES:  You may resume regular (light) daily activities beginning the next day--such as daily self-care, walking, climbing stairs--gradually increasing activities as tolerated.  You may have sexual intercourse when it is comfortable.  Refrain from any heavy lifting or straining until approved by your doctor. °a. You may drive when you are no longer taking prescription pain medication, you can comfortably wear a seatbelt, and you can safely maneuver your car and apply brakes. °b. RETURN TO WORK:  __________________________________________________________ °9. You should see your doctor in the office for a follow-up appointment approximately 2-3 weeks after your surgery.  Make sure that you call for this appointment within a day or two after you arrive home to insure a convenient appointment time. °10. OTHER INSTRUCTIONS: __________________________________________________________________________________________________________________________ __________________________________________________________________________________________________________________________ °WHEN TO CALL YOUR DOCTOR: °1. Fever over 101.0 °2. Inability to urinate °3. Continued bleeding from incision. °4. Increased pain, redness, or drainage from the incision. °5. Increasing abdominal pain ° °The clinic staff is available to answer your questions during regular business hours.  Please don’t hesitate to call and ask to speak to one of the nurses for clinical concerns.  If you have a medical emergency, go to the nearest emergency room or call 911.  A surgeon from [facility] is always on call at the hospital. °[address], [address], [place], [place]  [address] ? [address] [address], , [place]   [address] °[phone] ? [phone] ? FAX [phone] °Web site:   [email or web address] °

## 2019-04-20 NOTE — Anesthesia Postprocedure Evaluation (Signed)
Anesthesia Post Note  Patient: Catherine Elliott  Procedure(s) Performed: Laparoscopic Cholecystectomy (N/A Abdomen)     Patient location during evaluation: PACU Anesthesia Type: General Level of consciousness: awake and alert Pain management: pain level controlled Vital Signs Assessment: post-procedure vital signs reviewed and stable Respiratory status: spontaneous breathing, nonlabored ventilation and respiratory function stable Cardiovascular status: blood pressure returned to baseline and stable Postop Assessment: no apparent nausea or vomiting Anesthetic complications: no    Last Vitals:  Vitals:   04/20/19 0924 04/20/19 0932  BP: 133/62 101/62  Pulse: 77 75  Resp: 20 14  Temp: 36.7 C   SpO2: 99% 96%    Last Pain:  Vitals:   04/20/19 0932  PainSc: Seltzer Brock

## 2019-04-20 NOTE — Interval H&P Note (Signed)
History and Physical Interval Note:  04/20/2019 7:17 AM  Catherine Elliott  has presented today for surgery, with the diagnosis of GALL STONES.  The various methods of treatment have been discussed with the patient and family. After consideration of risks, benefits and other options for treatment, the patient has consented to  Procedure(s): LAPAROSCOPIC CHOLECYSTECTOMY WITH POSSIBLE INTRAOPERATIVE CHOLANGIOGRAM (N/A) as a surgical intervention.  The patient's history has been reviewed, patient examined, no change in status, stable for surgery.  I have reviewed the patient's chart and labs.  Questions were answered to the patient's satisfaction.     [place]

## 2019-04-20 NOTE — Transfer of Care (Signed)
Immediate Anesthesia Transfer of Care Note  Patient: Catherine Elliott  Procedure(s) Performed: Laparoscopic Cholecystectomy (N/A Abdomen)  Patient Location: PACU  Anesthesia Type:General  Level of Consciousness: awake, alert  and oriented  Airway & Oxygen Therapy: Patient Spontanous Breathing  Post-op Assessment: Report given to RN and Post -op Vital signs reviewed and stable  Post vital signs: Reviewed and stable  Last Vitals:  Vitals Value Taken Time  BP 124/70 04/20/19 0910  Temp 36.6 C 04/20/19 0909  Pulse 84 04/20/19 0910  Resp 16 04/20/19 0910  SpO2 94 % 04/20/19 0910  Vitals shown include unvalidated device data.  Last Pain:  Vitals:   04/20/19 0909  PainSc: Asleep         Complications: No apparent anesthesia complications

## 2019-04-21 ENCOUNTER — Encounter (HOSPITAL_COMMUNITY): Payer: Self-pay | Admitting: Surgery

## 2019-04-21 ENCOUNTER — Encounter: Payer: Self-pay | Admitting: Internal Medicine

## 2019-04-21 LAB — SURGICAL PATHOLOGY

## 2019-06-09 ENCOUNTER — Encounter: Payer: Self-pay | Admitting: Internal Medicine

## 2019-06-09 ENCOUNTER — Ambulatory Visit: Payer: BC Managed Care – PPO | Admitting: Internal Medicine

## 2019-06-09 ENCOUNTER — Other Ambulatory Visit: Payer: Self-pay

## 2019-06-09 VITALS — BP 112/78 | HR 84 | Temp 98.3°F | Ht 66.0 in | Wt 253.8 lb

## 2019-06-09 DIAGNOSIS — R110 Nausea: Secondary | ICD-10-CM | POA: Diagnosis not present

## 2019-06-09 DIAGNOSIS — L732 Hidradenitis suppurativa: Secondary | ICD-10-CM

## 2019-06-09 DIAGNOSIS — K219 Gastro-esophageal reflux disease without esophagitis: Secondary | ICD-10-CM | POA: Diagnosis not present

## 2019-06-09 DIAGNOSIS — M7062 Trochanteric bursitis, left hip: Secondary | ICD-10-CM | POA: Diagnosis not present

## 2019-06-09 DIAGNOSIS — R1033 Periumbilical pain: Secondary | ICD-10-CM

## 2019-06-09 MED ORDER — ONDANSETRON 4 MG PO TBDP: 4.0000 mg | ORAL_TABLET | Freq: Three times a day (TID) | ORAL | 0 refills | Status: DC | PRN

## 2019-06-09 MED ORDER — DEXILANT 60 MG PO CPDR: 60.0000 mg | DELAYED_RELEASE_CAPSULE | Freq: Every day | ORAL | 1 refills | Status: DC

## 2019-06-09 MED ORDER — KETOROLAC TROMETHAMINE 60 MG/2ML IM SOLN
60.0000 mg | Freq: Once | INTRAMUSCULAR | Status: AC
  Administered 2019-06-09: 60 mg via INTRAMUSCULAR

## 2019-06-09 NOTE — Progress Notes (Signed)
This visit occurred during the SARS-CoV-2 public health emergency.  Safety protocols were in place, including screening questions prior to the visit, additional usage of staff PPE, and extensive cleaning of exam room while observing appropriate contact time as indicated for disinfecting solutions.  Subjective:     Patient ID: Catherine Elliott , female    DOB: Apr 18, 1971 , 49 y.o.   MRN: [id number]   Chief Complaint  Patient presents with  . Abdominal Pain    HPI  Abdominal Pain This is a recurrent problem. The problem occurs intermittently. The problem has been unchanged. The pain is located in the periumbilical region. The pain is at a severity of 5/10. The pain is moderate. The quality of the pain is cramping and aching. The abdominal pain radiates to the periumbilical region. Associated symptoms include arthralgias and nausea. Pertinent negatives include no anorexia or constipation. The pain is relieved by nothing.     Past Medical History:  Diagnosis Date  . H/O ulcerative colitis   . H/O: depression   . History of migraine headaches    frequent  . Hx: UTI (urinary tract infection)   . Left ovarian cyst    resolved 11/2010  . LLQ pain    h/o  . Pilonidal cyst    h/o x 2     Family History  Problem Relation Age of Onset  . COPD Mother   . Cancer Mother        ovarian  . Hypertension Mother   . Heart disease Father   . Nephrolithiasis Father   . Hypertension Father   . Diabetes Father   . Seizures Father   . Mental illness Sister        bipolar   . Seizures Brother   . Mental illness Brother        bipolar  . Cirrhosis Brother        ETOH use  . Cancer Maternal Aunt        stomach and uterine  . Cancer Maternal Grandmother        ovarian  . Cancer Paternal Grandmother        pancreatic     Current Outpatient Medications:  .  Calcium-Vitamin D-Vitamin K (VIACTIV CALCIUM PLUS D PO), Take 1 each by mouth 2 (two) times daily., Disp: , Rfl:  .  Misc Natural  Products (APPLE CIDER VINEGAR DIET PO), Take 1 each by mouth 2 (two) times daily., Disp: , Rfl:  .  Multiple Vitamins-Minerals (MULTIVITAMIN WITH MINERALS) tablet, Take 2 tablets by mouth daily. Vita Fusion, Disp: , Rfl:  .  dexlansoprazole (DEXILANT) 60 MG capsule, Take 1 capsule (60 mg total) by mouth daily., Disp: 30 capsule, Rfl: 1 .  omeprazole (PRILOSEC) 40 MG capsule, Take 40 mg by mouth daily., Disp: , Rfl:    No Known Allergies   Review of Systems  Constitutional: Negative.   Respiratory: Negative.   Cardiovascular: Negative.   Gastrointestinal: Positive for abdominal pain and nausea. Negative for anorexia and constipation.  Musculoskeletal: Positive for arthralgias.       She c/o left hip pain. Has h/o bursitis. Has pain when lying on her left side. Denies fall/trauma. She is not sure what triggered her sx.   Neurological: Negative.   Psychiatric/Behavioral: Negative.      Today's Vitals   06/09/19 1633  BP: 112/78  Pulse: 84  Temp: 98.3 F (36.8 C)  TempSrc: Oral  Weight: 253 lb 12.8 oz (115.1 kg)  Height: 5'  6" (1.676 m)   Body mass index is 40.96 kg/m.   Objective:  Physical Exam Vitals and nursing note reviewed.  Constitutional:      Appearance: Normal appearance.  HENT:     Head: Normocephalic and atraumatic.  Cardiovascular:     Rate and Rhythm: Normal rate and regular rhythm.     Heart sounds: Normal heart sounds.  Pulmonary:     Effort: Pulmonary effort is normal.     Breath sounds: Normal breath sounds.  Abdominal:     General: Bowel sounds are normal.     Palpations: Abdomen is soft.     Tenderness: There is abdominal tenderness in the periumbilical area.  Skin:    General: Skin is warm.  Neurological:     General: No focal deficit present.     Mental Status: She is alert.  Psychiatric:        Mood and Affect: Mood normal.        Behavior: Behavior normal.         Assessment And Plan:     1. Periumbilical pain  She has had similar sx  in the past; she has since had a cholecystectomy. Her sx possibly related to gas/reflux. I will switch her from omeprazole to Dexilant once daily. She will take omeprazole twice daily if Dexilant requires PA. She is in agreement with her treatment plan.   2. Nausea  Recurrent. She was given rx Zofran prn.   3. Gastroesophageal reflux disease without esophagitis  Chronic. Please see #1.   4. Trochanteric bursitis of left hip  She was given Toradol, 60mg  IM x 1. She is advised to avoid oral NSAIDS due to concomitant GI issue.   - ketorolac (TORADOL) injection 60 mg  5. Hidradenitis suppurativa  Chronic. I will refer her to Derm for further evaluation.   - Ambulatory referral to Dermatology    , MD    THE PATIENT IS ENCOURAGED TO PRACTICE SOCIAL DISTANCING DUE TO THE COVID-19 PANDEMIC.

## 2019-06-09 NOTE — Patient Instructions (Signed)
Dr. Haverstock--dermatologist  Hidradenitis Suppurativa Hidradenitis suppurativa is a long-term (chronic) skin disease. It is similar to a severe form of acne, but it affects areas of the body where acne would be unusual, especially areas of the body where skin rubs against skin and becomes moist. These include:  Underarms.  Groin.  Genital area.  Buttocks.  Upper thighs.  Breasts. Hidradenitis suppurativa may start out as small lumps or pimples caused by blocked sweat glands or hair follicles. Pimples may develop into deep sores that break open (rupture) and drain pus. Over time, affected areas of skin may thicken and become scarred. This condition is rare and does not spread from person to person (non-contagious). What are the causes? The exact cause of this condition is not known. It may be related to:  Female and female hormones.  An overactive disease-fighting system (immune system). The immune system may over-react to blocked hair follicles or sweat glands and cause swelling and pus-filled sores. What increases the risk? You are more likely to develop this condition if you:  Are female.  Are 28-10 years old.  Have a family history of hidradenitis suppurativa.  Have a personal history of acne.  Are overweight.  Smoke.  Take the medicine lithium. What are the signs or symptoms? The first symptoms are usually painful bumps in the skin, similar to pimples. The condition may get worse over time (progress), or it may only cause mild symptoms. If the disease progresses, symptoms may include:  Skin bumps getting bigger and growing deeper into the skin.  Bumps rupturing and draining pus.  Itchy, infected skin.  Skin getting thicker and scarred.  Tunnels under the skin (fistulas) where pus drains from a bump.  Pain during daily activities, such as pain during walking if your groin area is affected.  Emotional problems, such as stress or depression. This condition may  affect your appearance and your ability or willingness to wear certain clothes or do certain activities. How is this diagnosed? This condition is diagnosed by a health care provider who specializes in skin diseases (dermatologist). You may be diagnosed based on:  Your symptoms and medical history.  A physical exam.  Testing a pus sample for infection.  Blood tests. How is this treated? Your treatment will depend on how severe your symptoms are. The same treatment will not work for everybody with this condition. You may need to try several treatments to find what works best for you. Treatment may include:  Cleaning and bandaging (dressing) your wounds as needed.  Lifestyle changes, such as new skin care routines.  Taking medicines, such as: ? Antibiotics. ? Acne medicines. ? Medicines to reduce the activity of the immune system. ? A diabetes medicine (metformin). ? Birth control pills, for women. ? Steroids to reduce swelling and pain.  Working with a mental health care provider, if you experience emotional distress due to this condition. If you have severe symptoms that do not get better with medicine, you may need surgery. Surgery may involve:  Using a laser to clear the skin and remove hair follicles.  Opening and draining deep sores.  Removing the areas of skin that are diseased and scarred. Follow these instructions at home: Medicines   Take over-the-counter and prescription medicines only as told by your health care provider.  If you were prescribed an antibiotic medicine, take it as told by your health care provider. Do not stop taking the antibiotic even if your condition improves. Skin care  If you have  open wounds, cover them with a clean dressing as told by your health care provider. Keep wounds clean by washing them gently with soap and water when you bathe.  Do not shave the areas where you get hidradenitis suppurativa.  Do not wear deodorant.  Wear  loose-fitting clothes.  Try to avoid getting overheated or sweaty. If you get sweaty or wet, change into clean, dry clothes as soon as you can.  To help relieve pain and itchiness, cover sore areas with a warm, clean washcloth (warm compress) for 5-10 minutes as often as needed.  If told by your health care provider, take a bleach bath twice a week: ? Fill your bathtub halfway with water. ? Pour in  cup of unscented household bleach. ? Soak in the tub for 5-10 minutes. ? Only soak from the neck down. Avoid water on your face and hair. ? Shower to rinse off the bleach from your skin. General instructions  Learn as much as you can about your disease so that you have an active role in your treatment. Work closely with your health care provider to find treatments that work for you.  If you are overweight, work with your health care provider to lose weight as recommended.  Do not use any products that contain nicotine or tobacco, such as cigarettes and e-cigarettes. If you need help quitting, ask your health care provider.  If you struggle with living with this condition, talk with your health care provider or work with a mental health care provider as recommended.  Keep all follow-up visits as told by your health care provider. This is important. Where to find more information  Hidradenitis Suppurativa Foundation[employer].: https://www.hs-foundation.org/ Contact a health care provider if you have:  A flare-up of hidradenitis suppurativa.  A fever or chills.  Trouble controlling your symptoms at home.  Trouble doing your daily activities because of your symptoms.  Trouble dealing with emotional problems related to your condition. Summary  Hidradenitis suppurativa is a long-term (chronic) skin disease. It is similar to a severe form of acne, but it affects areas of the body where acne would be unusual.  The first symptoms are usually painful bumps in the skin, similar to pimples. The  condition may get worse over time (progress), or it may only cause mild symptoms.  If you have open wounds, cover them with a clean dressing as told by your health care provider. Keep wounds clean by washing them gently with soap and water when you bathe.  Besides skin care, treatment may include medicines, laser treatment, and surgery. This information is not intended to replace advice given to you by your health care provider. Make sure you discuss any questions you have with your health care provider. Document Revised: 05/28/2017 Document Reviewed: 05/28/2017 Elsevier Patient Education  2020 [employer].

## 2019-06-11 ENCOUNTER — Encounter: Payer: Self-pay | Admitting: Internal Medicine

## 2020-02-15 ENCOUNTER — Other Ambulatory Visit: Payer: Self-pay

## 2020-02-15 ENCOUNTER — Encounter: Payer: Self-pay | Admitting: Internal Medicine

## 2020-02-15 MED ORDER — SPIRONOLACTONE 50 MG PO TABS: 50.0000 mg | ORAL_TABLET | Freq: Every day | ORAL | 0 refills | Status: AC

## 2020-02-15 MED ORDER — SILVER SULFADIAZINE 1 % EX CREA: 1.0000 "application " | TOPICAL_CREAM | Freq: Every day | CUTANEOUS | 0 refills | Status: AC

## 2020-02-17 ENCOUNTER — Encounter: Payer: Self-pay | Admitting: Internal Medicine

## 2020-02-17 ENCOUNTER — Other Ambulatory Visit: Payer: Self-pay | Admitting: Internal Medicine

## 2020-02-17 ENCOUNTER — Ambulatory Visit: Payer: BC Managed Care – PPO | Admitting: Internal Medicine

## 2020-02-17 ENCOUNTER — Other Ambulatory Visit: Payer: Self-pay

## 2020-02-17 ENCOUNTER — Encounter: Payer: BC Managed Care – PPO | Admitting: Internal Medicine

## 2020-02-17 VITALS — BP 126/62 | HR 60 | Temp 98.0°F | Ht 65.8 in | Wt 274.0 lb

## 2020-02-17 DIAGNOSIS — M542 Cervicalgia: Secondary | ICD-10-CM

## 2020-02-17 DIAGNOSIS — Z23 Encounter for immunization: Secondary | ICD-10-CM

## 2020-02-17 DIAGNOSIS — Z1231 Encounter for screening mammogram for malignant neoplasm of breast: Secondary | ICD-10-CM

## 2020-02-17 DIAGNOSIS — Z0000 Encounter for general adult medical examination without abnormal findings: Secondary | ICD-10-CM

## 2020-02-17 DIAGNOSIS — Z6841 Body Mass Index (BMI) 40.0 and over, adult: Secondary | ICD-10-CM

## 2020-02-17 DIAGNOSIS — L732 Hidradenitis suppurativa: Secondary | ICD-10-CM | POA: Insufficient documentation

## 2020-02-17 MED ORDER — CYCLOBENZAPRINE HCL 10 MG PO TABS: ORAL_TABLET | ORAL | 0 refills | Status: DC

## 2020-02-17 NOTE — Progress Notes (Signed)
This visit occurred during the SARS-CoV-2 public health emergency.  Safety protocols were in place, including screening questions prior to the visit, additional usage of staff PPE, and extensive cleaning of exam room while observing appropriate contact time as indicated for disinfecting solutions.  Subjective:     Patient ID: Catherine Elliott , female    DOB: November 26, 1970 , 49 y.o.   MRN: [id number]   Chief Complaint  Patient presents with  . Annual Exam    HPI  She is here today for a full physical examination. She is followed by [facility] for her GYN exams. She was last seen in Nov 2020 for her pap smear. She denies having any specific concerns or complaints at this time.     Past Medical History:  Diagnosis Date  . H/O ulcerative colitis   . H/O: depression   . History of migraine headaches    frequent  . Hx: UTI (urinary tract infection)   . Left ovarian cyst    resolved 11/2010  . LLQ pain    h/o  . Pilonidal cyst    h/o x 2     Family History  Problem Relation Age of Onset  . COPD Mother   . Cancer Mother        ovarian  . Hypertension Mother   . Heart disease Father   . Nephrolithiasis Father   . Hypertension Father   . Diabetes Father   . Seizures Father   . Mental illness Sister        bipolar   . Seizures Brother   . Mental illness Brother        bipolar  . Cirrhosis Brother        ETOH use  . Cancer Maternal Aunt        stomach and uterine  . Cancer Maternal Grandmother        ovarian  . Cancer Paternal Grandmother        pancreatic     Current Outpatient Medications:  .  Calcium-Vitamin D-Vitamin K (VIACTIV CALCIUM PLUS D PO), Take 1 each by mouth 2 (two) times daily., Disp: , Rfl:  .  cyclobenzaprine (FLEXERIL) 10 MG tablet, One tab po qhs prn, Disp: 30 tablet, Rfl: 0 .  Misc Natural Products (APPLE CIDER VINEGAR DIET PO), Take 1 each by mouth 2 (two) times daily., Disp: , Rfl:  .  silver sulfADIAZINE (SILVADENE) 1 % cream, Apply 1 application  topically daily., Disp: 50 g, Rfl: 0 .  spironolactone (ALDACTONE) 50 MG tablet, Take 1 tablet (50 mg total) by mouth daily., Disp: 90 tablet, Rfl: 0   Allergies  Allergen Reactions  . Prochlorperazine Other (See Comments)    juandice       The patient states she uses none for birth control. Last LMP was Patient's last menstrual period was 01/23/2020.. Negative for Dysmenorrhea. Negative for: breast discharge, breast lump(s), breast pain and breast self exam. Associated symptoms include abnormal vaginal bleeding. Pertinent negatives include abnormal bleeding (hematology), anxiety, decreased libido, depression, difficulty falling sleep, dyspareunia, history of infertility, nocturia, sexual dysfunction, sleep disturbances, urinary incontinence, urinary urgency, vaginal discharge and vaginal itching. Diet regular.The patient states her exercise level is  minimal.   . The patient's tobacco use is:  Social History   Tobacco Use  Smoking Status Never Smoker  Smokeless Tobacco Never Used  . She has been exposed to passive smoke. The patient's alcohol use is:  Social History   Substance and Sexual Activity  Alcohol Use No   Review of Systems  Constitutional: Negative.   HENT: Negative.   Eyes: Negative.   Respiratory: Negative.   Cardiovascular: Negative.   Endocrine: Negative.   Genitourinary: Negative.   Musculoskeletal: Positive for neck pain.       She c/o neck pain. Denies UE paresthesias. Denies UE weakness.   Skin: Negative.   Allergic/Immunologic: Negative.   Neurological: Negative.   Hematological: Negative.   Psychiatric/Behavioral: Negative.      Today's Vitals   02/17/20 1210  BP: 126/62  Pulse: 60  Temp: 98 F (36.7 C)  Weight: 274 lb (124.3 kg)  Height: 5' 5.8" (1.671 m)   Body mass index is 44.49 kg/m.   Objective:  Physical Exam Vitals and nursing note reviewed.  Constitutional:      General: She is not in acute distress.    Appearance: Normal  appearance. She is well-developed. She is obese.  HENT:     Head: Normocephalic and atraumatic.     Right Ear: Hearing, tympanic membrane, ear canal and external ear normal. There is no impacted cerumen.     Left Ear: Hearing, tympanic membrane, ear canal and external ear normal. There is no impacted cerumen.     Nose:     Comments: Deferred, masked    Mouth/Throat:     Comments: Deferred, masked Eyes:     General: Lids are normal.     Extraocular Movements: Extraocular movements intact.     Conjunctiva/sclera: Conjunctivae normal.     Pupils: Pupils are equal, round, and reactive to light.     Funduscopic exam:    Right eye: No papilledema.        Left eye: No papilledema.  Neck:     Thyroid: No thyroid mass.     Vascular: No carotid bruit.     Comments: There is b/l trapezius muscular tenderness. Posterior cervical muscular tenderness.  Cardiovascular:     Rate and Rhythm: Normal rate and regular rhythm.     Pulses: Normal pulses.     Heart sounds: Normal heart sounds. No murmur heard.   Pulmonary:     Effort: Pulmonary effort is normal.     Breath sounds: Normal breath sounds.  Chest:     Breasts: Tanner Score is 5.        Right: Normal.        Left: Normal.  Abdominal:     General: Bowel sounds are normal. There is no distension.     Palpations: Abdomen is soft.     Tenderness: There is no abdominal tenderness.     Comments: Rounded, soft  Genitourinary:    Rectum: Guaiac result negative.  Musculoskeletal:        General: No swelling. Normal range of motion.     Cervical back: Full passive range of motion without pain, normal range of motion and neck supple. Tenderness present.     Right lower leg: No edema.     Left lower leg: No edema.  Skin:    General: Skin is warm and dry.     Capillary Refill: Capillary refill takes less than 2 seconds.  Neurological:     General: No focal deficit present.     Mental Status: She is alert and oriented to person, place, and  time.     Cranial Nerves: No cranial nerve deficit.     Sensory: No sensory deficit.  Psychiatric:        Mood and Affect: Mood normal.  Behavior: Behavior normal.        Thought Content: Thought content normal.        Judgment: Judgment normal.         Assessment And Plan:     1. Routine general medical examination at health care facility Comments: A full exam was performed. Importance of monthly self breast exams was discussed with the patient. PATIENT IS ADVISED TO GET 30-45 MINUTES REGULAR EXERCISE NO LESS THAN FOUR TO FIVE DAYS PER WEEK - BOTH WEIGHTBEARING EXERCISES AND AEROBIC ARE RECOMMENDED.  PATIENT IS ADVISED TO FOLLOW A HEALTHY DIET WITH AT LEAST SIX FRUITS/VEGGIES PER DAY, DECREASE INTAKE OF RED MEAT, AND TO INCREASE FISH INTAKE TO TWO DAYS PER WEEK.  MEATS/FISH SHOULD NOT BE FRIED, BAKED OR BROILED IS PREFERABLE.  I SUGGEST WEARING SPF 50 SUNSCREEN ON EXPOSED PARTS AND ESPECIALLY WHEN IN THE DIRECT SUNLIGHT FOR AN EXTENDED PERIOD OF TIME.  PLEASE AVOID FAST FOOD RESTAURANTS AND INCREASE YOUR WATER INTAKE.  - CBC - CMP14+EGFR - Lipid panel - VITAMIN D 25 Hydroxy (Vit-D Deficiency, Fractures)  2. Cervicalgia Comments: She was given rx cyclobenzaprine10mg  nightly to use prn. If persistent, she is encouraged to consider chiropractic therapy.   3. Class 3 severe obesity due to excess calories without serious comorbidity with body mass index (BMI) of 40.0 to 44.9 in adult [facility]) Comments: BMI 44. She is encouraged to initially strive for BMI less than 38 to decrease cardiac risk. She is encouraged to aim for at least 150 minutes of exercise per week. I will also refer her for [facility] program.  - Ambulatory referral to Internal Medicine  4. Immunization due Comments: She declines flu vaccine.     Patient was given opportunity to ask questions. Patient verbalized understanding of the plan and was able to repeat key elements of the plan. All questions were  answered to their satisfaction.   Maximino Greenland, MD   I, Maximino Greenland, MD, have reviewed all documentation for this visit. The documentation on 02/20/20 for the exam, diagnosis, procedures, and orders are all accurate and complete.  THE PATIENT IS ENCOURAGED TO PRACTICE SOCIAL DISTANCING DUE TO THE COVID-19 PANDEMIC.

## 2020-02-17 NOTE — Patient Instructions (Signed)
Health Maintenance, Female Adopting a healthy lifestyle and getting preventive care are important in promoting health and wellness. Ask your health care provider about:  The right schedule for you to have regular tests and exams.  Things you can do on your own to prevent diseases and keep yourself healthy. What should I know about diet, weight, and exercise? Eat a healthy diet   Eat a diet that includes plenty of vegetables, fruits, low-fat dairy products, and lean protein.  Do not eat a lot of foods that are high in solid fats, added sugars, or sodium. Maintain a healthy weight Body mass index (BMI) is used to identify weight problems. It estimates body fat based on height and weight. Your health care provider can help determine your BMI and help you achieve or maintain a healthy weight. Get regular exercise Get regular exercise. This is one of the most important things you can do for your health. Most adults should:  Exercise for at least 150 minutes each week. The exercise should increase your heart rate and make you sweat (moderate-intensity exercise).  Do strengthening exercises at least twice a week. This is in addition to the moderate-intensity exercise.  Spend less time sitting. Even light physical activity can be beneficial. Watch cholesterol and blood lipids Have your blood tested for lipids and cholesterol at 51 years of age, then have this test every 5 years. Have your cholesterol levels checked more often if:  Your lipid or cholesterol levels are high.  You are older than 51 years of age.  You are at high risk for heart disease. What should I know about cancer screening? Depending on your health history and family history, you may need to have cancer screening at various ages. This may include screening for:  Breast cancer.  Cervical cancer.  Colorectal cancer.  Skin cancer.  Lung cancer. What should I know about heart disease, diabetes, and high blood  pressure? Blood pressure and heart disease  High blood pressure causes heart disease and increases the risk of stroke. This is more likely to develop in people who have high blood pressure readings, are of African descent, or are overweight.  Have your blood pressure checked: ? Every 3-5 years if you are 18-39 years of age. ? Every year if you are 40 years old or older. Diabetes Have regular diabetes screenings. This checks your fasting blood sugar level. Have the screening done:  Once every three years after age 40 if you are at a normal weight and have a low risk for diabetes.  More often and at a younger age if you are overweight or have a high risk for diabetes. What should I know about preventing infection? Hepatitis B If you have a higher risk for hepatitis B, you should be screened for this virus. Talk with your health care provider to find out if you are at risk for hepatitis B infection. Hepatitis C Testing is recommended for:  Everyone born from 1945 through 1965.  Anyone with known risk factors for hepatitis C. Sexually transmitted infections (STIs)  Get screened for STIs, including gonorrhea and chlamydia, if: ? You are sexually active and are younger than 51 years of age. ? You are older than 51 years of age and your health care provider tells you that you are at risk for this type of infection. ? Your sexual activity has changed since you were last screened, and you are at increased risk for chlamydia or gonorrhea. Ask your health care provider if   you are at risk.  Ask your health care provider about whether you are at high risk for HIV. Your health care provider may recommend a prescription medicine to help prevent HIV infection. If you choose to take medicine to prevent HIV, you should first get tested for HIV. You should then be tested every 3 months for as long as you are taking the medicine. Pregnancy  If you are about to stop having your period (premenopausal) and  you may become pregnant, seek counseling before you get pregnant.  Take 400 to 800 micrograms (mcg) of folic acid every day if you become pregnant.  Ask for birth control (contraception) if you want to prevent pregnancy. Osteoporosis and menopause Osteoporosis is a disease in which the bones lose minerals and strength with aging. This can result in bone fractures. If you are 65 years old or older, or if you are at risk for osteoporosis and fractures, ask your health care provider if you should:  Be screened for bone loss.  Take a calcium or vitamin D supplement to lower your risk of fractures.  Be given hormone replacement therapy (HRT) to treat symptoms of menopause. Follow these instructions at home: Lifestyle  Do not use any products that contain nicotine or tobacco, such as cigarettes, e-cigarettes, and chewing tobacco. If you need help quitting, ask your health care provider.  Do not use street drugs.  Do not share needles.  Ask your health care provider for help if you need support or information about quitting drugs. Alcohol use  Do not drink alcohol if: ? Your health care provider tells you not to drink. ? You are pregnant, may be pregnant, or are planning to become pregnant.  If you drink alcohol: ? Limit how much you use to 0-1 drink a day. ? Limit intake if you are breastfeeding.  Be aware of how much alcohol is in your drink. In the [place] one drink equals one 12 oz bottle of beer (355 mL), one 5 oz glass of wine (148 mL), or one 1 oz glass of hard liquor (44 mL). General instructions  Schedule regular health, dental, and eye exams.  Stay current with your vaccines.  Tell your health care provider if: ? You often feel depressed. ? You have ever been abused or do not feel safe at home. Summary  Adopting a healthy lifestyle and getting preventive care are important in promoting health and wellness.  Follow your health care provider's instructions about healthy  diet, exercising, and getting tested or screened for diseases.  Follow your health care provider's instructions on monitoring your cholesterol and blood pressure. This information is not intended to replace advice given to you by your health care provider. Make sure you discuss any questions you have with your health care provider. Document Revised: 05/13/2018 Document Reviewed: 05/13/2018 Elsevier Patient Education  2020 [employer]  

## 2020-02-17 NOTE — Patient Instructions (Addendum)
Dr. Mauro Kaufmann at [facility] on [address]  [phone]   Health Maintenance, Female Adopting a healthy lifestyle and getting preventive care are important in promoting health and wellness. Ask your health care provider about:  The right schedule for you to have regular tests and exams.  Things you can do on your own to prevent diseases and keep yourself healthy. What should I know about diet, weight, and exercise? Eat a healthy diet   Eat a diet that includes plenty of vegetables, fruits, low-fat dairy products, and lean protein.  Do not eat a lot of foods that are high in solid fats, added sugars, or sodium. Maintain a healthy weight Body mass index (BMI) is used to identify weight problems. It estimates body fat based on height and weight. Your health care provider can help determine your BMI and help you achieve or maintain a healthy weight. Get regular exercise Get regular exercise. This is one of the most important things you can do for your health. Most adults should:  Exercise for at least 150 minutes each week. The exercise should increase your heart rate and make you sweat (moderate-intensity exercise).  Do strengthening exercises at least twice a week. This is in addition to the moderate-intensity exercise.  Spend less time sitting. Even light physical activity can be beneficial. Watch cholesterol and blood lipids Have your blood tested for lipids and cholesterol at 51 years of age, then have this test every 5 years. Have your cholesterol levels checked more often if:  Your lipid or cholesterol levels are high.  You are older than 51 years of age.  You are at high risk for heart disease. What should I know about cancer screening? Depending on your health history and family history, you may need to have cancer screening at various ages. This may include screening for:  Breast cancer.  Cervical cancer.  Colorectal cancer.  Skin cancer.  Lung  cancer. What should I know about heart disease, diabetes, and high blood pressure? Blood pressure and heart disease  High blood pressure causes heart disease and increases the risk of stroke. This is more likely to develop in people who have high blood pressure readings, are of African descent, or are overweight.  Have your blood pressure checked: ? Every 3-5 years if you are 51-4 years of age. ? Every year if you are 51 years old or older. Diabetes Have regular diabetes screenings. This checks your fasting blood sugar level. Have the screening done:  Once every three years after age 2 if you are at a normal weight and have a low risk for diabetes.  More often and at a younger age if you are overweight or have a high risk for diabetes. What should I know about preventing infection? Hepatitis B If you have a higher risk for hepatitis B, you should be screened for this virus. Talk with your health care provider to find out if you are at risk for hepatitis B infection. Hepatitis C Testing is recommended for:  Everyone born from [birth year] through 1965.  Anyone with known risk factors for hepatitis C. Sexually transmitted infections (STIs)  Get screened for STIs, including gonorrhea and chlamydia, if: ? You are sexually active and are younger than 51 years of age. ? You are older than 51 years of age and your health care provider tells you that you are at risk for this type of infection. ? Your sexual activity has changed since you were last screened, and you are at  increased risk for chlamydia or gonorrhea. Ask your health care provider if you are at risk.  Ask your health care provider about whether you are at high risk for HIV. Your health care provider may recommend a prescription medicine to help prevent HIV infection. If you choose to take medicine to prevent HIV, you should first get tested for HIV. You should then be tested every 3 months for as long as you are taking the  medicine. Pregnancy  If you are about to stop having your period (premenopausal) and you may become pregnant, seek counseling before you get pregnant.  Take 400 to 800 micrograms (mcg) of folic acid every day if you become pregnant.  Ask for birth control (contraception) if you want to prevent pregnancy. Osteoporosis and menopause Osteoporosis is a disease in which the bones lose minerals and strength with aging. This can result in bone fractures. If you are 24 years old or older, or if you are at risk for osteoporosis and fractures, ask your health care provider if you should:  Be screened for bone loss.  Take a calcium or vitamin D supplement to lower your risk of fractures.  Be given hormone replacement therapy (HRT) to treat symptoms of menopause. Follow these instructions at home: Lifestyle  Do not use any products that contain nicotine or tobacco, such as cigarettes, e-cigarettes, and chewing tobacco. If you need help quitting, ask your health care provider.  Do not use street drugs.  Do not share needles.  Ask your health care provider for help if you need support or information about quitting drugs. Alcohol use  Do not drink alcohol if: ? Your health care provider tells you not to drink. ? You are pregnant, may be pregnant, or are planning to become pregnant.  If you drink alcohol: ? Limit how much you use to 0-1 drink a day. ? Limit intake if you are breastfeeding.  Be aware of how much alcohol is in your drink. In the [place] one drink equals one 12 oz bottle of beer (355 mL), one 5 oz glass of wine (148 mL), or one 1 oz glass of hard liquor (44 mL). General instructions  Schedule regular health, dental, and eye exams.  Stay current with your vaccines.  Tell your health care provider if: ? You often feel depressed. ? You have ever been abused or do not feel safe at home. Summary  Adopting a healthy lifestyle and getting preventive care are important in  promoting health and wellness.  Follow your health care provider's instructions about healthy diet, exercising, and getting tested or screened for diseases.  Follow your health care provider's instructions on monitoring your cholesterol and blood pressure. This information is not intended to replace advice given to you by your health care provider. Make sure you discuss any questions you have with your health care provider. Document Revised: 05/13/2018 Document Reviewed: 05/13/2018 Elsevier Patient Education  2020 [employer].

## 2020-02-17 NOTE — Progress Notes (Signed)
Erroneous encounter-duplicate encounter

## 2020-02-18 LAB — LIPID PANEL
Chol/HDL Ratio: 4.5 ratio — ABNORMAL HIGH (ref 0.0–4.4)
Cholesterol, Total: 217 mg/dL — ABNORMAL HIGH (ref 100–199)
HDL: 48 mg/dL (ref >39)
LDL Chol Calc (NIH): 150 mg/dL — ABNORMAL HIGH (ref 0–99)
Triglycerides: 107 mg/dL (ref 0–149)
VLDL Cholesterol Cal: 19 mg/dL (ref 5–40)

## 2020-02-18 LAB — CMP14+EGFR
ALT: 9 IU/L (ref 0–32)
AST: 12 IU/L (ref 0–40)
Albumin/Globulin Ratio: 1.3 (ref 1.2–2.2)
Albumin: 4.4 g/dL (ref 3.8–4.9)
Alkaline Phosphatase: 104 IU/L (ref 44–121)
BUN/Creatinine Ratio: 12 (ref 9–23)
BUN: 12 mg/dL (ref 6–24)
Bilirubin Total: 0.5 mg/dL (ref 0.0–1.2)
CO2: 24 mmol/L (ref 20–29)
Calcium: 9.8 mg/dL (ref 8.7–10.2)
Chloride: 102 mmol/L (ref 96–106)
Creatinine, Ser: 1.04 mg/dL — ABNORMAL HIGH (ref 0.57–1.00)
GFR calc Af Amer: 72 mL/min/{1.73_m2} (ref >59)
GFR calc non Af Amer: 62 mL/min/{1.73_m2} (ref >59)
Globulin, Total: 3.5 g/dL (ref 1.5–4.5)
Glucose: 93 mg/dL (ref 65–99)
Potassium: 4.4 mmol/L (ref 3.5–5.2)
Sodium: 140 mmol/L (ref 134–144)
Total Protein: 7.9 g/dL (ref 6.0–8.5)

## 2020-02-18 LAB — CBC
Hematocrit: 45.6 % (ref 34.0–46.6)
Hemoglobin: 15.2 g/dL (ref 11.1–15.9)
MCH: 30.4 pg (ref 26.6–33.0)
MCHC: 33.3 g/dL (ref 31.5–35.7)
MCV: 91 fL (ref 79–97)
Platelets: 297 10*3/uL (ref 150–450)
RBC: 5 x10E6/uL (ref 3.77–5.28)
RDW: 12.3 % (ref 11.7–15.4)
WBC: 8.5 10*3/uL (ref 3.4–10.8)

## 2020-02-18 LAB — VITAMIN D 25 HYDROXY (VIT D DEFICIENCY, FRACTURES): Vit D, 25-Hydroxy: 24.6 ng/mL — ABNORMAL LOW (ref 30.0–100.0)

## 2020-02-21 ENCOUNTER — Encounter: Payer: Self-pay | Admitting: Internal Medicine

## 2020-03-14 ENCOUNTER — Ambulatory Visit
Admission: RE | Admit: 2020-03-14 | Discharge: 2020-03-14 | Disposition: A | Discharge status: Home / Self Care | Payer: BC Managed Care – PPO | Source: Ambulatory Visit | Attending: Internal Medicine | Admitting: Internal Medicine

## 2020-03-14 ENCOUNTER — Other Ambulatory Visit: Payer: Self-pay

## 2020-03-14 DIAGNOSIS — Z1231 Encounter for screening mammogram for malignant neoplasm of breast: Secondary | ICD-10-CM

## 2020-03-17 ENCOUNTER — Other Ambulatory Visit: Payer: Self-pay | Admitting: Internal Medicine

## 2020-03-17 DIAGNOSIS — R928 Other abnormal and inconclusive findings on diagnostic imaging of breast: Secondary | ICD-10-CM

## 2020-03-30 ENCOUNTER — Ambulatory Visit: Payer: BC Managed Care – PPO

## 2020-03-30 ENCOUNTER — Ambulatory Visit
Admission: RE | Admit: 2020-03-30 | Discharge: 2020-03-30 | Disposition: A | Discharge status: Home / Self Care | Payer: BC Managed Care – PPO | Source: Ambulatory Visit | Attending: Internal Medicine | Admitting: Internal Medicine

## 2020-03-30 ENCOUNTER — Other Ambulatory Visit: Payer: Self-pay

## 2020-03-30 DIAGNOSIS — R928 Other abnormal and inconclusive findings on diagnostic imaging of breast: Secondary | ICD-10-CM

## 2020-06-27 ENCOUNTER — Ambulatory Visit: Payer: BC Managed Care – PPO | Admitting: Internal Medicine

## 2020-09-29 IMAGING — NM NM HEPATO W/GB/PHARM/[PERSON_NAME]
3 series · 18 of 18 positions shown · non-contrast
Comparison: None.

CLINICAL DATA: Right upper quadrant pain

EXAM:
NUCLEAR MEDICINE HEPATOBILIARY IMAGING WITH GALLBLADDER EF
TECHNIQUE: Sequential images of the abdomen were obtained [DATE] minutes
following intravenous administration of radiopharmaceutical. After
oral ingestion of Ensure, gallbladder ejection fraction was
determined. At 60 min, normal ejection fraction is greater than 33%.
RADIOPHARMACEUTICALS:  5.2 mCi Qc-77m  Choletec IV

[Series 1: raw data · 4.46mm/px · 6 of 30 frames shown (1 of 3)]
[frame 3/30]
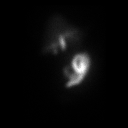
[frame 8/30]
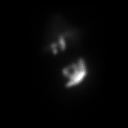
[frame 13/30]
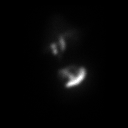
[frame 18/30]
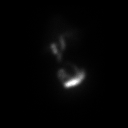
[frame 23/30]
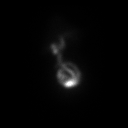
[frame 28/30]
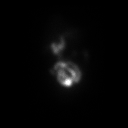

[Series 1: raw data · 4.46mm/px · 6 of 60 frames shown (2 of 3)]
[frame 6/60]
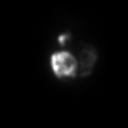
[frame 16/60]
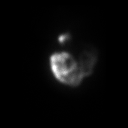
[frame 26/60]
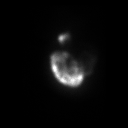
[frame 36/60]
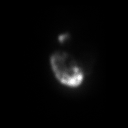
[frame 46/60]
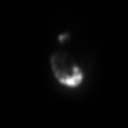
[frame 56/60]
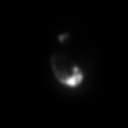

[Series 1: raw data · 4.46mm/px · 6 of 60 frames shown (3 of 3)]
[frame 6/60]
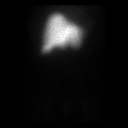
[frame 16/60]
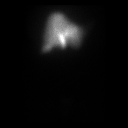
[frame 26/60]
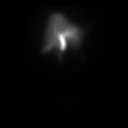
[frame 36/60]
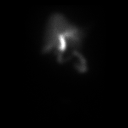
[frame 46/60]
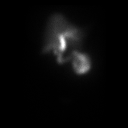
[frame 56/60]
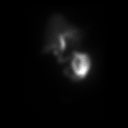

[18 of 18 positions shown; findings below may reference images not displayed]

FINDINGS: Prompt uptake and biliary excretion of activity by the liver is
seen. Gallbladder activity is visualized, consistent with patency of
cystic duct. Biliary activity passes into small bowel, consistent
with patent common bile duct.

Calculated gallbladder ejection fraction is 65%. (Normal gallbladder
ejection fraction with Ensure is greater than 33%.)
IMPRESSION: 1. Patent cystic duct without evidence for acute cholecystitis.
2. Normal gallbladder ejection fraction.

## 2021-03-01 ENCOUNTER — Encounter: Payer: BC Managed Care – PPO | Admitting: Internal Medicine

## 2021-03-20 ENCOUNTER — Other Ambulatory Visit: Payer: Self-pay | Admitting: Internal Medicine

## 2021-03-20 DIAGNOSIS — Z1231 Encounter for screening mammogram for malignant neoplasm of breast: Secondary | ICD-10-CM

## 2021-04-17 ENCOUNTER — Other Ambulatory Visit: Payer: Self-pay

## 2021-04-17 ENCOUNTER — Ambulatory Visit
Admission: RE | Admit: 2021-04-17 | Discharge: 2021-04-17 | Disposition: A | Discharge status: Home / Self Care | Payer: BC Managed Care – PPO | Source: Ambulatory Visit | Attending: Internal Medicine | Admitting: Internal Medicine

## 2021-04-17 DIAGNOSIS — Z1231 Encounter for screening mammogram for malignant neoplasm of breast: Secondary | ICD-10-CM

## 2021-06-25 ENCOUNTER — Encounter: Payer: Self-pay | Admitting: Internal Medicine

## 2021-08-22 ENCOUNTER — Ambulatory Visit: Payer: BC Managed Care – PPO | Admitting: Internal Medicine

## 2021-09-06 ENCOUNTER — Ambulatory Visit: Payer: BC Managed Care – PPO | Admitting: Internal Medicine

## 2021-09-13 ENCOUNTER — Encounter: Payer: Self-pay | Admitting: Internal Medicine

## 2021-09-13 ENCOUNTER — Ambulatory Visit: Payer: BC Managed Care – PPO | Admitting: Internal Medicine

## 2021-09-13 VITALS — BP 124/82 | HR 78 | Temp 98.5°F | Ht 65.8 in | Wt 247.0 lb

## 2021-09-13 DIAGNOSIS — Z6841 Body Mass Index (BMI) 40.0 and over, adult: Secondary | ICD-10-CM

## 2021-09-13 DIAGNOSIS — L989 Disorder of the skin and subcutaneous tissue, unspecified: Secondary | ICD-10-CM | POA: Diagnosis not present

## 2021-09-13 DIAGNOSIS — E559 Vitamin D deficiency, unspecified: Secondary | ICD-10-CM

## 2021-09-13 DIAGNOSIS — N289 Disorder of kidney and ureter, unspecified: Secondary | ICD-10-CM

## 2021-09-13 NOTE — Progress Notes (Signed)
?Catherine Elliott,acting as a Education administrator for Catherine Greenland, MD.,have documented all relevant documentation on the behalf of Catherine Greenland, MD,as directed by  Catherine Greenland, MD while in the presence of Catherine Greenland, MD.  ?This visit occurred during the SARS-CoV-2 public health emergency.  Safety protocols were in place, including screening questions prior to the visit, additional usage of staff PPE, and extensive cleaning of exam room while observing appropriate contact time as indicated for disinfecting solutions. ? ?Subjective:  ?  ? Patient ID: Catherine Elliott , female    DOB: May 31, 1971 , 51 y.o.   MRN: [id number] ? ? ?Chief Complaint  ?Patient presents with  ? head indentions  ? ? ?HPI ? ?Patient presents today for head indentions. She reports she noticed them at the end of January. She denies fever, chills and headaches. She is not sure what may have triggered her sx. She is also followed by Derm for other issues, has yet to discuss with them. She wants to be sure nothing serious is causing this.  ?  ? ?Past Medical History:  ?Diagnosis Date  ? H/O ulcerative colitis   ? H/O: depression   ? History of migraine headaches   ? frequent  ? Hx: UTI (urinary tract infection)   ? Left ovarian cyst   ? resolved 11/2010  ? LLQ pain   ? h/o  ? Pilonidal cyst   ? h/o x 2  ?  ? ?Family History  ?Problem Relation Age of Onset  ? COPD Mother   ? Cancer Mother   ?     ovarian  ? Hypertension Mother   ? Heart disease Father   ? Nephrolithiasis Father   ? Hypertension Father   ? Diabetes Father   ? Seizures Father   ? Mental illness Sister   ?     bipolar   ? Seizures Brother   ? Mental illness Brother   ?     bipolar  ? Cirrhosis Brother   ?     ETOH use  ? Cancer Maternal Aunt   ?     stomach and uterine  ? Cancer Maternal Grandmother   ?     ovarian  ? Cancer Paternal Grandmother   ?     pancreatic  ? ? ? ?Current Outpatient Medications:  ?  doxycycline (ADOXA) 50 MG tablet, Take 50 mg by mouth as needed., Disp: , Rfl:   ?  silver sulfADIAZINE (SILVADENE) 1 % cream, Apply 1 application topically daily., Disp: 50 g, Rfl: 0 ?  spironolactone (ALDACTONE) 50 MG tablet, Take 1 tablet (50 mg total) by mouth daily., Disp: 90 tablet, Rfl: 0 ?  Vitamin D, Ergocalciferol, (DRISDOL) 1.25 MG (50000 UNIT) CAPS capsule, Take 1 capsule (50,000 Units total) by mouth 2 (two) times a week., Disp: 24 capsule, Rfl: 0  ? ?Allergies  ?Allergen Reactions  ? Prochlorperazine Other (See Comments)  ?  juandice ?  ?  ? ?Review of Systems  ?Constitutional: Negative.   ?Respiratory: Negative.    ?Cardiovascular: Negative.   ?Gastrointestinal: Negative.   ?Neurological: Negative.   ?Psychiatric/Behavioral: Negative.     ? ?Today's Vitals  ? 09/13/21 1410  ?BP: 124/82  ?Pulse: 78  ?Temp: 98.5 ?F (36.9 ?C)  ?Weight: 247 lb (112 kg)  ?Height: 5' 5.8" (1.671 m)  ?PainSc: 0-No pain  ? ?Body mass index is 40.11 kg/m?.  ?Wt Readings from Last 3 Encounters:  ?09/13/21 247 lb (112 kg)  ?  02/17/20 274 lb (124.3 kg)  ?02/17/20 274 lb 6.4 oz (124.5 kg)  ?  ? ?Objective:  ?Physical Exam ?Vitals and nursing note reviewed.  ?Constitutional:   ?   Appearance: Normal appearance.  ?HENT:  ?   Head: Normocephalic and atraumatic.  ?Eyes:  ?   Extraocular Movements: Extraocular movements intact.  ?Cardiovascular:  ?   Rate and Rhythm: Normal rate and regular rhythm.  ?   Heart sounds: Normal heart sounds.  ?Pulmonary:  ?   Effort: Pulmonary effort is normal.  ?   Breath sounds: Normal breath sounds.  ?Musculoskeletal:  ?   Cervical back: Normal range of motion.  ?Skin: ?   General: Skin is warm.  ?   Comments: There is no scalp erythema, fluctuance in area of question. There are indentations in her scalp in frontal region.   ?Neurological:  ?   General: No focal deficit present.  ?   Mental Status: She is alert.  ?Psychiatric:     ?   Mood and Affect: Mood normal.     ?   Behavior: Behavior normal.  ?   ?Assessment And Plan:  ?   ?1. Disorder of scalp ?Comments: There is possible  scalp thickening,she is encouraged to f/u with Derm. Infection does not appear to be present.  ? ?2. Vitamin D deficiency ?Comments: I will check vitamin D level and supplement as needed.  ?- Vitamin D (25 hydroxy) ?- CMP14+EGFR ? ?3. Renal insufficiency ?Comments: Review of labs show decreased GFr, I will repeat today. She is encouraged to stay well hydrated.  ?- CMP14+EGFR ?- CBC no Diff ? ?4. Class 3 severe obesity due to excess calories without serious comorbidity with body mass index (BMI) of 40.0 to 44.9 in adult [facility]) ?Comments: She is encouraged to resume her regular exercise regimen. She is advised to aim for at least 150 minutes of exercise per week.  ?  ?Patient was given opportunity to ask questions. Patient verbalized understanding of the plan and was able to repeat key elements of the plan. All questions were answered to their satisfaction.  ? ?I, Catherine Greenland, MD, have reviewed all documentation for this visit. The documentation on 09/13/21 for the exam, diagnosis, procedures, and orders are all accurate and complete.  ? ?IF YOU HAVE BEEN REFERRED TO A SPECIALIST, IT MAY TAKE 1-2 WEEKS TO SCHEDULE/PROCESS THE REFERRAL. IF YOU HAVE NOT HEARD FROM US/SPECIALIST IN TWO WEEKS, PLEASE GIVE [place] A CALL AT [phone] X [phone].  ? ?THE PATIENT IS ENCOURAGED TO PRACTICE SOCIAL DISTANCING DUE TO THE COVID-19 PANDEMIC.   ?

## 2021-09-14 LAB — CMP14+EGFR
ALT: 10 IU/L (ref 0–32)
AST: 11 IU/L (ref 0–40)
Albumin/Globulin Ratio: 1.5 (ref 1.2–2.2)
Albumin: 4.2 g/dL (ref 3.8–4.9)
Alkaline Phosphatase: 108 IU/L (ref 44–121)
BUN/Creatinine Ratio: 13 (ref 9–23)
BUN: 13 mg/dL (ref 6–24)
Bilirubin Total: 0.3 mg/dL (ref 0.0–1.2)
CO2: 26 mmol/L (ref 20–29)
Calcium: 9.7 mg/dL (ref 8.7–10.2)
Chloride: 102 mmol/L (ref 96–106)
Creatinine, Ser: 1.03 mg/dL — ABNORMAL HIGH (ref 0.57–1.00)
Globulin, Total: 2.8 g/dL (ref 1.5–4.5)
Glucose: 99 mg/dL (ref 70–99)
Potassium: 4.7 mmol/L (ref 3.5–5.2)
Sodium: 141 mmol/L (ref 134–144)
Total Protein: 7 g/dL (ref 6.0–8.5)
eGFR: 65 mL/min/{1.73_m2} (ref >59)

## 2021-09-14 LAB — CBC
Hematocrit: 41.8 % (ref 34.0–46.6)
Hemoglobin: 14.3 g/dL (ref 11.1–15.9)
MCH: 31.4 pg (ref 26.6–33.0)
MCHC: 34.2 g/dL (ref 31.5–35.7)
MCV: 92 fL (ref 79–97)
Platelets: 242 10*3/uL (ref 150–450)
RBC: 4.55 x10E6/uL (ref 3.77–5.28)
RDW: 11.7 % (ref 11.7–15.4)
WBC: 7.7 10*3/uL (ref 3.4–10.8)

## 2021-09-14 LAB — VITAMIN D 25 HYDROXY (VIT D DEFICIENCY, FRACTURES): Vit D, 25-Hydroxy: 20.1 ng/mL — ABNORMAL LOW (ref 30.0–100.0)

## 2021-09-19 ENCOUNTER — Other Ambulatory Visit: Payer: Self-pay

## 2021-09-19 ENCOUNTER — Encounter: Payer: Self-pay | Admitting: Internal Medicine

## 2021-09-19 MED ORDER — VITAMIN D (ERGOCALCIFEROL) 1.25 MG (50000 UNIT) PO CAPS: 50000.0000 [IU] | ORAL_CAPSULE | ORAL | 0 refills | Status: DC

## 2021-09-22 ENCOUNTER — Encounter: Payer: Self-pay | Admitting: Internal Medicine

## 2021-12-11 ENCOUNTER — Encounter: Payer: Self-pay | Admitting: Internal Medicine

## 2021-12-20 ENCOUNTER — Telehealth: Payer: BC Managed Care – PPO | Admitting: Internal Medicine

## 2021-12-20 ENCOUNTER — Encounter: Payer: Self-pay | Admitting: Internal Medicine

## 2021-12-20 DIAGNOSIS — M952 Other acquired deformity of head: Secondary | ICD-10-CM | POA: Diagnosis not present

## 2021-12-20 DIAGNOSIS — R519 Headache, unspecified: Secondary | ICD-10-CM

## 2021-12-20 DIAGNOSIS — R0981 Nasal congestion: Secondary | ICD-10-CM | POA: Diagnosis not present

## 2021-12-20 MED ORDER — NOREL AD 4-10-325 MG PO TABS: 4.0000 mg | ORAL_TABLET | Freq: Two times a day (BID) | ORAL | 1 refills | Status: DC | PRN

## 2021-12-20 NOTE — Progress Notes (Signed)
Virtual Visit via Video   This visit type was conducted due to national recommendations for restrictions regarding the COVID-19 Pandemic (e.g. social distancing) in an effort to limit this patient's exposure and mitigate transmission in our community.  Due to her co-morbid illnesses, this patient is at least at moderate risk for complications without adequate follow up.  This format is felt to be most appropriate for this patient at this time.  All issues noted in this document were discussed and addressed.  A limited physical exam was performed with this format.    This visit type was conducted due to national recommendations for restrictions regarding the COVID-19 Pandemic (e.g. social distancing) in an effort to limit this patient's exposure and mitigate transmission in our community.  Patients identity confirmed using two different identifiers.  This format is felt to be most appropriate for this patient at this time.  All issues noted in this document were discussed and addressed.  No physical exam was performed (except for noted visual exam findings with Video Visits).    Date:  12/20/2021   ID:  Catherine Elliott, DOB Aug 25, 1970, MRN [id number]  Patient Location:  Home  Provider location:   Office    Chief Complaint:  "I   History of Present Illness:    Catherine Elliott is a 51 y.o. female who presents via video conferencing for a telehealth visit today.    The patient does not have symptoms concerning for COVID-19 infection (fever, chills, cough, or new shortness of breath).   She presents today for virtual visit. She prefers this method of contact due to COVID-19 pandemic.  She presents today for further evaluation of headache/sinus congestion. She denies fever/chills. Denies ill contacts. There is some associated fatigue. She is most concerned about a scalp indentation that she thinks is worsening. She is not sure if the headaches are related to this new finding. Headaches have  increased in frequency and severity. They are frontal headaches, no visual disturbances. There is no scalp tenderness or redness. She has been evaluated by Derm who did not find any scalp abnormalities.     Headache  This is a recurrent problem. The pain is located in the Bilateral and frontal region. The pain does not radiate. The quality of the pain is described as aching. The pain is moderate. Associated symptoms include sinus pressure. Pertinent negatives include no abdominal pain or scalp tenderness.     Past Medical History:  Diagnosis Date   H/O ulcerative colitis    H/O: depression    History of migraine headaches    frequent   Hx: UTI (urinary tract infection)    Left ovarian cyst    resolved 11/2010   LLQ pain    h/o   Pilonidal cyst    h/o x 2   Past Surgical History:  Procedure Laterality Date   CHOLECYSTECTOMY N/A 04/20/2019   Procedure: Laparoscopic Cholecystectomy;  Surgeon: Catherine Bouillon, MD;  Location: [facility] OR;  Service: General;  Laterality: N/A;   COLONOSCOPY     x5   ESOPHAGOGASTRODUODENOSCOPY     EYE SURGERY     lasix   PILONIDAL CYST EXCISION  2008/2011     Current Meds  Medication Sig   Chlorphen-PE-Acetaminophen (NOREL AD) 4-10-325 MG TABS Take 4 mg by mouth 2 (two) times daily as needed.   doxycycline (ADOXA) 50 MG tablet Take 50 mg by mouth as needed.   silver sulfADIAZINE (SILVADENE) 1 % cream Apply 1 application topically daily.  spironolactone (ALDACTONE) 50 MG tablet Take 1 tablet (50 mg total) by mouth daily.     Allergies:   Prochlorperazine   Social History   Tobacco Use   Smoking status: Never   Smokeless tobacco: Never  Vaping Use   Vaping Use: Never used  Substance Use Topics   Alcohol use: No   Drug use: No     Family Hx: The patient's family history includes COPD in her mother; Cancer in her maternal aunt, maternal grandmother, mother, and paternal grandmother; Cirrhosis in her brother; Diabetes in her father; Heart disease  in her father; Hypertension in her father and mother; Mental illness in her brother and sister; Nephrolithiasis in her father; Seizures in her brother and father.  ROS:   Please see the history of present illness.    Review of Systems  Constitutional: Negative.   HENT:  Positive for congestion and sinus pressure.   Respiratory: Negative.    Cardiovascular: Negative.   Gastrointestinal: Negative.  Negative for abdominal pain.  Neurological:  Positive for headaches.  Psychiatric/Behavioral: Negative.      All other systems reviewed and are negative.   Labs/Other Tests and Data Reviewed:    Recent Labs: 09/13/2021: ALT 10; BUN 13; Creatinine, Ser 1.03; Hemoglobin 14.3; Platelets 242; Potassium 4.7; Sodium 141   Recent Lipid Panel Lab Results  Component Value Date/Time   CHOL 217 (H) 02/17/2020 09:59 AM   TRIG 107 02/17/2020 09:59 AM   HDL 48 02/17/2020 09:59 AM   CHOLHDL 4.5 (H) 02/17/2020 09:59 AM   LDLCALC 150 (H) 02/17/2020 09:59 AM    Wt Readings from Last 3 Encounters:  09/13/21 247 lb (112 kg)  02/17/20 274 lb (124.3 kg)  02/17/20 274 lb 6.4 oz (124.5 kg)     Exam:    Vital Signs:  There were no vitals taken for this visit.    Physical Exam Vitals and nursing note reviewed.  Constitutional:      Appearance: Normal appearance.  HENT:     Head: Normocephalic and atraumatic.  Eyes:     Extraocular Movements: Extraocular movements intact.  Pulmonary:     Effort: Pulmonary effort is normal.  Musculoskeletal:     Cervical back: Normal range of motion.  Neurological:     Mental Status: She is alert and oriented to person, place, and time.  Psychiatric:        Mood and Affect: Affect normal.     ASSESSMENT & PLAN:   1. Skull deformity Comments: Due to worsening headaches and possible worsening of defect, I will refer her for head CT.  - CT HEAD WO CONTRAST ( ); Future  2. Frontal headache Comments: She agrees to monitor her BP at home, does not have h/o  HTN.  - CT HEAD WO CONTRAST ( ); Future  3. Sinus congestion Comments: I will send rx Norel AD to take twice daily as needed. She should avoid dairy while symptomatic. She will let me know if her sx persist.     COVID-19 Education: The signs and symptoms of COVID-19 were discussed with the patient and how to seek care for testing (follow up with PCP or arrange E-visit).  The importance of social distancing was discussed today.  Patient Risk:   After full review of this patients clinical status, I feel that they are at least moderate risk at this time.  Time:   Today, I have spent 10 minutes/ seconds with the patient with telehealth technology discussing above diagnoses.  Medication Adjustments/Labs and Tests Ordered: Current medicines are reviewed at length with the patient today.  Concerns regarding medicines are outlined above.   Tests Ordered: Orders Placed This Encounter  Procedures   CT HEAD WO CONTRAST ( )    Medication Changes: Meds ordered this encounter  Medications   Chlorphen-PE-Acetaminophen (NOREL AD) 4-10-325 MG TABS    Sig: Take 4 mg by mouth 2 (two) times daily as needed.    Dispense:  20 tablet    Refill:  1    Disposition:  Follow up prn  Signed, Gwynneth Aliment, MD

## 2021-12-31 ENCOUNTER — Ambulatory Visit
Admission: RE | Admit: 2021-12-31 | Discharge: 2021-12-31 | Disposition: A | Discharge status: Home / Self Care | Payer: BC Managed Care – PPO | Source: Ambulatory Visit | Attending: Internal Medicine | Admitting: Internal Medicine

## 2021-12-31 DIAGNOSIS — M952 Other acquired deformity of head: Secondary | ICD-10-CM

## 2021-12-31 DIAGNOSIS — R519 Headache, unspecified: Secondary | ICD-10-CM

## 2022-01-01 ENCOUNTER — Other Ambulatory Visit: Payer: Self-pay | Admitting: Internal Medicine

## 2022-01-01 ENCOUNTER — Encounter: Payer: Self-pay | Admitting: Internal Medicine

## 2022-01-01 DIAGNOSIS — R519 Headache, unspecified: Secondary | ICD-10-CM

## 2022-01-21 ENCOUNTER — Telehealth: Payer: Self-pay | Admitting: Psychiatry

## 2022-01-21 NOTE — Telephone Encounter (Signed)
Rescheduled 9/12 appt with pt over the phone- MD out.

## 2022-02-12 ENCOUNTER — Ambulatory Visit: Payer: BC Managed Care – PPO | Admitting: Psychiatry

## 2022-02-18 NOTE — Progress Notes (Unsigned)
Referring:  Dorothyann Peng, MD [address] [place],  [place] [address]  PCP: Dorothyann Peng, MD  Neurology was asked to evaluate Catherine Elliott, a 51 year old female for a chief complaint of headaches.  Our recommendations of care will be communicated by shared medical record.    CC:  headaches  History provided from self  HPI:  Medical co-morbidities: hidradenitis suppurativa  The patient presents for evaluation of headaches which she's had for several years, but have worsened in the past year. She does note increased stress in her life recently as she has been helping take care of her father.  Headaches are described as frontal throbbing with associated phonophobia and nausea. They can last up to 3 days at a time. Headache frequency varies. In the past 3-4 months she has had multiple per week, though she has recently gone 2 weeks without one.  Has never been diagnosed with migraines, but was diagnosed with "sinus headaches" as a kid.  [facility] 12/31/21 was unremarkable.  Headache History: Onset: worsened 1 year ago Triggers: stress Aura: none Location: frontal Quality/Description: throbbing, aching Associated Symptoms:  Photophobia: yes  Phonophobia: no  Nausea: yes Worse with activity?: yes Duration of headaches: several days  Headache days per month: 8 Headache free days per month: 22  Current Treatment: Abortive Tylenol Excedrin  Preventative none  Prior Therapies                                 Tylenol Excedrin   LABS: CBC    Component Value Date/Time   WBC 7.7 09/13/2021 1446   WBC 7.1 04/14/2019 0814   RBC 4.55 09/13/2021 1446   RBC 4.83 04/14/2019 0814   HGB 14.3 09/13/2021 1446   HCT 41.8 09/13/2021 1446   PLT 242 09/13/2021 1446   MCV 92 09/13/2021 1446   MCH 31.4 09/13/2021 1446   MCH 29.8 04/14/2019 0814   MCHC 34.2 09/13/2021 1446   MCHC 32.7 04/14/2019 0814   RDW 11.7 09/13/2021 1446   LYMPHSABS 2.0 04/14/2019 0814    MONOABS 0.5 04/14/2019 0814   EOSABS 0.1 04/14/2019 0814   BASOSABS 0.1 04/14/2019 0814      Latest Ref Rng & Units 09/13/2021    2:46 PM 02/17/2020    9:59 AM 04/14/2019    8:14 AM  CMP  Glucose 70 - 99 mg/dL 99  93  284   BUN 6 - 24 mg/dL 13  12  6    Creatinine 0.57 - 1.00 mg/dL  1.32  4.40   Sodium 134 - 144 mmol/L 141  140  140   Potassium 3.5 - 5.2 mmol/L 4.7  4.4  3.9   Chloride 96 - 106 mmol/L 102  102  108   CO2 20 - 29 mmol/L 26  24  23    Calcium 8.7 - 10.2 mg/dL 9.7  9.8  9.4   Total Protein 6.0 - 8.5 g/dL 7.0  7.9  7.4   Total Bilirubin 0.0 - 1.2 mg/dL 0.3  0.5  1.0   Alkaline Phos 44 - 121 IU/L 108  104  119   AST 0 - 40 IU/L 11  12  104   ALT 0 - 32 IU/L 10  9  86      IMAGING:  [facility] 12/31/21: unremarkable  Imaging independently reviewed on February 19, 2022   Current Outpatient Medications on File Prior to Visit  Medication Sig Dispense Refill   doxycycline (ADOXA) 50 MG tablet Take 50 mg by mouth as needed.     silver sulfADIAZINE (SILVADENE) 1 % cream Apply 1 application topically daily. 50 g 0   spironolactone (ALDACTONE) 50 MG tablet Take 1 tablet (50 mg total) by mouth daily. 90 tablet 0   No current facility-administered medications on file prior to visit.     Allergies: Allergies  Allergen Reactions   Prochlorperazine Other (See Comments)    juandice     Family History: Migraine or other headaches in the family:  no Aneurysms in a first degree relative:  no Brain tumors in the family:  no Other neurological illness in the family:   no  Past Medical History: Past Medical History:  Diagnosis Date   H/O ulcerative colitis    H/O: depression    History of migraine headaches    frequent   Hx: UTI (urinary tract infection)    Left ovarian cyst    resolved 11/2010   LLQ pain    h/o   Pilonidal cyst    h/o x 2    Past Surgical History Past Surgical History:  Procedure Laterality Date   CHOLECYSTECTOMY N/A 04/20/2019   Procedure:  Laparoscopic Cholecystectomy;  Surgeon: Erroll Luna, MD;  Location: [facility];  Service: General;  Laterality: N/A;   COLONOSCOPY     x5   ESOPHAGOGASTRODUODENOSCOPY     EYE SURGERY     lasix   PILONIDAL CYST EXCISION  2008/2011    Social History: Social History   Tobacco Use   Smoking status: Never   Smokeless tobacco: Never  Vaping Use   Vaping Use: Never used  Substance Use Topics   Alcohol use: No   Drug use: No     ROS: Negative for fevers, chills. Positive for headache. All other systems reviewed and negative unless stated otherwise in HPI.   Physical Exam:   Vital Signs: BP (!) 163/90   Pulse 78   Ht 5\' 8"  (1.727 m)   Wt 277 lb 2 oz (125.7 kg)   BMI 42.14 kg/m  GENERAL: well appearing,in no acute distress,alert SKIN:  Color, texture, turgor normal. No rashes or lesions HEAD:  Normocephalic/atraumatic. CV:  RRR RESP: Normal respiratory effort MSK: +tenderness to palpation over right occiput and neck  NEUROLOGICAL: Mental Status: Alert, oriented to person, place and time,Follows commands Cranial Nerves: PERRL, visual fields intact to confrontation, extraocular movements intact, decreased sensation right V1, no facial droop or ptosis, hearing grossly intact, no dysarthria Motor: muscle strength 5/5 both upper and lower extremities,no drift, normal tone Reflexes: 2+ throughout Sensation: decreased sensation to light touch RUE, otherwise intact to light touch throughout Coordination: Finger-to- nose-finger intact bilaterally Gait: normal-based   IMPRESSION: 51 year old female with a history of hidradenitis suppurativa who presents for evaluation of worsening headaches over the past year. Will order brain MRI as exam reveals decreased sensation in her right face and upper extremity. Her headache pattern is most consistent with migraine without aura. Will start Maxalt for migraine rescue. She would prefer to hold off on starting a preventive at this  time.  PLAN: -MRI brain -Rescue: Start Maxalt 10 mg PRN -Supplement and dietary information for headache prevention provided   I spent a total of 23 minutes chart reviewing and counseling the patient. Headache education was done. Discussed treatment options including acute medications and natural supplements. Discussed medication overuse headache and to limit use of acute treatments to no more  than 2 days/week or 10 days/month. Discussed medication side effects, adverse reactions and drug interactions. Written educational materials and patient instructions outlining all of the above were given.  Follow-up: 3 months   Ocie Doyne, MD 02/19/2022   10:46 AM

## 2022-02-19 ENCOUNTER — Ambulatory Visit: Payer: BC Managed Care – PPO | Admitting: Psychiatry

## 2022-02-19 VITALS — BP 163/90 | HR 78 | Ht 68.0 in | Wt 277.1 lb

## 2022-02-19 DIAGNOSIS — R200 Anesthesia of skin: Secondary | ICD-10-CM | POA: Diagnosis not present

## 2022-02-19 DIAGNOSIS — G43009 Migraine without aura, not intractable, without status migrainosus: Secondary | ICD-10-CM

## 2022-02-19 MED ORDER — LORAZEPAM 0.5 MG PO TABS: ORAL_TABLET | ORAL | 0 refills | Status: DC

## 2022-02-19 MED ORDER — RIZATRIPTAN BENZOATE 10 MG PO TABS: 10.0000 mg | ORAL_TABLET | ORAL | 6 refills | Status: DC | PRN

## 2022-02-19 NOTE — Patient Instructions (Signed)
Plan:  Brain MRI Start rizatriptan as needed for migraines. Take at the onset of migraine. If headache recurs or does not fully resolve, you may take a second dose after 2 hours. Please avoid taking more than 2 days per week to avoid rebound headaches   GENERAL HEADACHE INFORMATION:  Natural supplements: Magnesium Oxide or Magnesium Glycinate 500 mg at bed (up to 800 mg daily) Coenzyme Q10 300 mg in AM Vitamin B2- 200 mg twice a day  Add 1 supplement at a time since even natural supplements can have undesirable side effects. You can sometimes buy supplements cheaper (especially Coenzyme Q10) at www.https://compton-perez.com/ or at [employer].  Vitamins and herbs that show potential:  Magnesium: Magnesium (250 mg twice a day or 500 mg at bed) has a relaxant effect on smooth muscles such as blood vessels. Individuals suffering from frequent or daily headache usually have low magnesium levels which can be increase with daily supplementation of 400-750 mg. Three trials found 40-90% average headache reduction  when used as a preventative. Magnesium also demonstrated the benefit in menstrually related migraine.  Magnesium is part of the messenger system in the serotonin cascade and it is a good muscle relaxant.  It is also useful for constipation which can be a side effect of other medications used to treat migraine. Good sources include nuts, whole grains, and tomatoes. Side Effects: loose stool/diarrhea Riboflavin (vitamin B 2) 200 mg twice a day. This vitamin assists nerve cells in the production of ATP a principal energy storing molecule.  It is necessary for many chemical reactions in the body.  There have been at least 3 clinical trials of riboflavin using 400 mg per day all of which suggested that migraine frequency can be decreased.  All 3 trials showed significant improvement in over half of migraine sufferers.  The supplement is found in bread, cereal, milk, meat, and poultry.  Most Americans get more riboflavin  than the recommended daily allowance, however riboflavin deficiency is not necessary for the supplements to help prevent headache. Side effects: energizing, green urine  Coenzyme Q10: This is present in almost all cells in the body and is critical component for the conversion of energy.  Recent studies have shown that a nutritional supplement of CoQ10 can reduce the frequency of migraine attacks by improving the energy production of cells as with riboflavin.  Doses of 150 mg twice a day have been shown to be effective.  Melatonin: Increasing evidence shows correlation between melatonin secretion and headache conditions.  Melatonin supplementation has decreased headache intensity and duration.  It is widely used as a sleep aid.  Sleep is natures way of dealing with migraine.  A dose of 3 mg is recommended to start for headaches including cluster headache. Higher doses up to 15 mg has been reviewed for use in Cluster headache and have been used. The rationale behind using melatonin for cluster is that many theories regarding the cause of Cluster headache center around the disruption of the normal circadian rhythm in the brain.  This helps restore the normal circadian rhythm.  HEADACHE DIET: Foods and beverages which may trigger migraine Note that only 20% of headache patients are food sensitive. You will know if you are food sensitive if you get a headache consistently 20 minutes to 2 hours after eating a certain food. Only cut out a food if it causes headaches, otherwise you might remove foods you enjoy! What matters most for diet is to eat a well balanced healthy diet full  of vegetables and low fat protein, and to not miss meals.  Chocolate, other sweets ALL cheeses except cottage and cream cheese Dairy products, yogurt, sour cream, ice cream Liver Meat extracts (Bovril, Marmite, meat tenderizers) Meats or fish which have undergone aging, fermenting, pickling or smoking. These include:  Hotdogs,salami,Lox,sausage, mortadellas,smoked salmon, pepperoni, Pickled herring Pods of broad bean (English beans, Chinese pea pods, [place] (fava) beans, lima and navy beans Ripe avocado, ripe banana Yeast extracts or active yeast preparations such as Brewer's or Fleishman's (commercial bakes goods are permitted) Tomato based foods, pizza (lasagna, etc.)  MSG (monosodium glutamate) is disguised as many things; look for these common aliases: Monopotassium glutamate Autolysed yeast Hydrolysed protein Sodium caseinate "flavorings" "all natural preservatives" Nutrasweet  Avoid all other foods that convincingly provoke headaches.  Resources: The Dizzy [place] Laundry Your Headache Diet, migrainestrong.com  https://zamora-andrews.com/  Caffeine and Migraine For patients that have migraine, caffeine intake more than 3 days per week can lead to dependency and increased migraine frequency. I would recommend cutting back on your caffeine intake as best you can. The recommended amount of caffeine is 200-300 mg daily, although migraine patients may experience dependency at even lower doses. While you may notice an increase in headache temporarily, cutting back will be helpful for headaches in the long run. For more information on caffeine and migraine, visit: https://americanmigrainefoundation.org/resource-library/caffeine-and-migraine/  Headache Prevention Strategies:  1. Maintain a headache diary; learn to identify and avoid triggers.  - This can be a simple note where you log when you had a headache, associated symptoms, and medications used - There are several smartphone apps developed to help track migraines: Migraine Buddy, Migraine Monitor, Curelator N1-Headache App  Common triggers include: Emotional triggers: Emotional/Upset family or friends Emotional/Upset occupation Business reversal/success Anticipation  anxiety Crisis-serious Post-crisis periodNew job/position   Physical triggers: Vacation Day Weekend Strenuous Exercise High Altitude Location New Move Menstrual Day Physical Illness Oversleep/Not enough sleep Weather changes Light: Photophobia or light sesnitivity treatment involves a balance between desensitization and reduction in overly strong input. Use dark polarized glasses outside, but not inside. Avoid bright or fluorescent light, but do not dim environment to the point that going into a normally lit room hurts. Consider FL-41 tint lenses, which reduce the most irritating wavelengths without blocking too much light.  These can be obtained at axonoptics.com or theraspecs.com Foods: see list above.  2. Limit use of acute treatments (over-the-counter medications, triptans, etc.) to no more than 2 days per week or 10 days per month to prevent medication overuse headache (rebound headache).    3. Follow a regular schedule (including weekends and holidays): Don't skip meals. Eat a balanced diet. 8 hours of sleep nightly. Minimize stress. Exercise 30 minutes per day. Being overweight is associated with a 5 times increased risk of chronic migraine. Keep well hydrated and drink 6-8 glasses of water per day.  4. Initiate non-pharmacologic measures at the earliest onset of your headache. Rest and quiet environment. Relax and reduce stress. Breathe2Relax is a free app that can instruct you on    some simple relaxtion and breathing techniques. Http://Dawnbuse.com is a    free website that provides teaching videos on relaxation.  Also, there are  many apps that   can be downloaded for "mindful" relaxation.  An app called YOGA NIDRA will help walk you through mindfulness. Another app called Calm can be downloaded to give you a structured mindfulness guide with daily reminders and skill development. Headspace for guided meditation Mindfulness Based Stress Reduction  Online Course:  www.palousemindfulness.com Cold compresses.  5. Don't wait!! Take the maximum allowable dosage of prescribed medication at the first sign of migraine.  6. Compliance:  Take prescribed medication regularly as directed and at the first sign of a migraine.  7. Communicate:  Call your physician when problems arise, especially if your headaches change, increase in frequency/severity, or become associated with neurological symptoms (weakness, numbness, slurred speech, etc.).  8. Headache/pain management therapies: Consider various complementary methods, including medication, behavioral therapy, psychological counselling, biofeedback, massage therapy, acupuncture, dry needling, and other modalities.  Such measures may reduce the need for medications. Counseling for pain management, where patients learn to function and ignore/minimize their pain, seems to work very well.  9. Recommend changing family's attention and focus away from patient's headaches. Instead, emphasize daily activities. If first question of day is 'How are your headaches/Do you have a headache today?', then patient will constantly think about headaches, thus making them worse. Goal is to re-direct attention away from headaches, toward daily activities and other distractions.  10. Helpful Websites: www.AmericanHeadacheSociety.org PatentHood.ch www.headaches.org TightMarket.nl www.achenet.org

## 2022-02-26 ENCOUNTER — Telehealth: Payer: Self-pay | Admitting: Psychiatry

## 2022-02-26 NOTE — Telephone Encounter (Signed)
Scheduled 03/07/22 at 11:45am

## 2022-02-26 NOTE — Telephone Encounter (Signed)
Dillon Bjork: [phone] exp. 02/26/22-03/27/22 sent to [facility] for open MRI

## 2022-03-07 MED ORDER — LORAZEPAM 2 MG PO TABS: ORAL_TABLET | ORAL | 0 refills | Status: AC

## 2022-03-07 NOTE — Telephone Encounter (Signed)
I sent a higher dose of ativan to her pharmacy. She can take 1-2 pills 30 mins prior to her MRI

## 2022-03-07 NOTE — Telephone Encounter (Signed)
From [facility]: This patient is very claustrophobic and was unable to complete open MRI even with the meds Genia Harold gave her. She has rescheduled to 10/17 815am. She will need stronger meds per patient. Please let Genia Harold know and reach out to the patient regarding meds.

## 2022-03-07 NOTE — Addendum Note (Signed)
Addended by: Genia Harold on: 03/07/2022 01:25 PM   Modules accepted: Orders

## 2022-03-15 ENCOUNTER — Encounter: Payer: Self-pay | Admitting: Psychiatry

## 2022-03-18 NOTE — Telephone Encounter (Signed)
Per patient request, I sent the MRI order to [facility] in [place] for an upright MRI. [phone]

## 2022-03-21 ENCOUNTER — Other Ambulatory Visit: Payer: BC Managed Care – PPO

## 2022-03-21 ENCOUNTER — Encounter: Payer: Self-pay | Admitting: Internal Medicine

## 2022-03-21 ENCOUNTER — Ambulatory Visit (INDEPENDENT_AMBULATORY_CARE_PROVIDER_SITE_OTHER): Payer: BC Managed Care – PPO | Admitting: Internal Medicine

## 2022-03-21 ENCOUNTER — Other Ambulatory Visit (HOSPITAL_COMMUNITY): Payer: Self-pay

## 2022-03-21 VITALS — BP 118/80 | HR 82 | Ht 68.0 in | Wt 270.0 lb

## 2022-03-21 DIAGNOSIS — F418 Other specified anxiety disorders: Secondary | ICD-10-CM | POA: Diagnosis not present

## 2022-03-21 DIAGNOSIS — Z2821 Immunization not carried out because of patient refusal: Secondary | ICD-10-CM | POA: Diagnosis not present

## 2022-03-21 DIAGNOSIS — Z0000 Encounter for general adult medical examination without abnormal findings: Secondary | ICD-10-CM | POA: Diagnosis not present

## 2022-03-21 DIAGNOSIS — Z6841 Body Mass Index (BMI) 40.0 and over, adult: Secondary | ICD-10-CM | POA: Diagnosis not present

## 2022-03-21 MED ORDER — WEGOVY 1 MG/0.5ML ~~LOC~~ SOAJ
1.0000 mg | SUBCUTANEOUS | 0 refills | Status: DC
  Filled 2022-03-21 – 2022-04-02 (×2): qty 2, 28d supply, fill #0

## 2022-03-21 MED ORDER — ESCITALOPRAM OXALATE 10 MG PO TABS: 10.0000 mg | ORAL_TABLET | Freq: Every day | ORAL | 2 refills | Status: DC

## 2022-03-21 NOTE — Patient Instructions (Addendum)
Magnesium glycinate nightly  Health Maintenance, Female Adopting a healthy lifestyle and getting preventive care are important in promoting health and wellness. Ask your health care provider about: The right schedule for you to have regular tests and exams. Things you can do on your own to prevent diseases and keep yourself healthy. What should I know about diet, weight, and exercise? Eat a healthy diet  Eat a diet that includes plenty of vegetables, fruits, low-fat dairy products, and lean protein. Do not eat a lot of foods that are high in solid fats, added sugars, or sodium. Maintain a healthy weight Body mass index (BMI) is used to identify weight problems. It estimates body fat based on height and weight. Your health care provider can help determine your BMI and help you achieve or maintain a healthy weight. Get regular exercise Get regular exercise. This is one of the most important things you can do for your health. Most adults should: Exercise for at least 150 minutes each week. The exercise should increase your heart rate and make you sweat (moderate-intensity exercise). Do strengthening exercises at least twice a week. This is in addition to the moderate-intensity exercise. Spend less time sitting. Even light physical activity can be beneficial. Watch cholesterol and blood lipids Have your blood tested for lipids and cholesterol at 53 years of age, then have this test every 5 years. Have your cholesterol levels checked more often if: Your lipid or cholesterol levels are high. You are older than 53 years of age. You are at high risk for heart disease. What should I know about cancer screening? Depending on your health history and family history, you may need to have cancer screening at various ages. This may include screening for: Breast cancer. Cervical cancer. Colorectal cancer. Skin cancer. Lung cancer. What should I know about heart disease, diabetes, and high blood  pressure? Blood pressure and heart disease High blood pressure causes heart disease and increases the risk of stroke. This is more likely to develop in people who have high blood pressure readings or are overweight. Have your blood pressure checked: Every 3-5 years if you are 75-53 years of age. Every year if you are 53 years old or older. Diabetes Have regular diabetes screenings. This checks your fasting blood sugar level. Have the screening done: Once every three years after age 53 if you are at a normal weight and have a low risk for diabetes. More often and at a younger age if you are overweight or have a high risk for diabetes. What should I know about preventing infection? Hepatitis B If you have a higher risk for hepatitis B, you should be screened for this virus. Talk with your health care provider to find out if you are at risk for hepatitis B infection. Hepatitis C Testing is recommended for: Everyone born from [birth year] through 1967. Anyone with known risk factors for hepatitis C. Sexually transmitted infections (STIs) Get screened for STIs, including gonorrhea and chlamydia, if: You are sexually active and are younger than 53 years of age. You are older than 53 years of age and your health care provider tells you that you are at risk for this type of infection. Your sexual activity has changed since you were last screened, and you are at increased risk for chlamydia or gonorrhea. Ask your health care provider if you are at risk. Ask your health care provider about whether you are at high risk for HIV. Your health care provider may recommend a prescription medicine  to help prevent HIV infection. If you choose to take medicine to prevent HIV, you should first get tested for HIV. You should then be tested every 3 months for as long as you are taking the medicine. Pregnancy If you are about to stop having your period (premenopausal) and you may become pregnant, seek counseling before you  get pregnant. Take 400 to 800 micrograms (mcg) of folic acid every day if you become pregnant. Ask for birth control (contraception) if you want to prevent pregnancy. Osteoporosis and menopause Osteoporosis is a disease in which the bones lose minerals and strength with aging. This can result in bone fractures. If you are 53 years old or older, or if you are at risk for osteoporosis and fractures, ask your health care provider if you should: Be screened for bone loss. Take a calcium or vitamin D supplement to lower your risk of fractures. Be given hormone replacement therapy (HRT) to treat symptoms of menopause. Follow these instructions at home: Alcohol use Do not drink alcohol if: Your health care provider tells you not to drink. You are pregnant, may be pregnant, or are planning to become pregnant. If you drink alcohol: Limit how much you have to: 0-1 drink a day. Know how much alcohol is in your drink. In the [place] one drink equals one 12 oz bottle of beer (355 mL), one 5 oz glass of wine (148 mL), or one 1 oz glass of hard liquor (44 mL). Lifestyle Do not use any products that contain nicotine or tobacco. These products include cigarettes, chewing tobacco, and vaping devices, such as e-cigarettes. If you need help quitting, ask your health care provider. Do not use street drugs. Do not share needles. Ask your health care provider for help if you need support or information about quitting drugs. General instructions Schedule regular health, dental, and eye exams. Stay current with your vaccines. Tell your health care provider if: You often feel depressed. You have ever been abused or do not feel safe at home. Summary Adopting a healthy lifestyle and getting preventive care are important in promoting health and wellness. Follow your health care provider's instructions about healthy diet, exercising, and getting tested or screened for diseases. Follow your health care provider's  instructions on monitoring your cholesterol and blood pressure. This information is not intended to replace advice given to you by your health care provider. Make sure you discuss any questions you have with your health care provider. Document Revised: 10/09/2020 Document Reviewed: 10/09/2020 Elsevier Patient Education  [place].

## 2022-03-21 NOTE — Progress Notes (Signed)
Rich Brave [place]ittleton,acting as a Education administrator for Maximino Greenland, MD.,have documented all relevant documentation on the behalf of Maximino Greenland, MD,as directed by  Maximino Greenland, MD while in the presence of Maximino Greenland, MD.   Subjective:     Patient ID: Catherine Elliott , female    DOB: 10/08/1970 , 51 y.o.   MRN: [id number]   Chief Complaint  Patient presents with   Annual Exam    HPI  She is here today for a full physical examination. She is followed by [facility] for her GYN exams. Patient reports she would like to discuss maybe starting a medication to help her anxiety. 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 She states work stress and family stress have worsened her symptoms.      Past Medical History:  Diagnosis Date   H/O ulcerative colitis    H/O: depression    History of migraine headaches    frequent   Hx: UTI (urinary tract infection)    Left ovarian cyst    resolved 11/2010   LLQ pain    h/o   Pilonidal cyst    h/o x 2     Family History  Problem Relation Age of Onset   COPD Mother    Cancer Mother        ovarian   Hypertension Mother    Heart disease Father    Nephrolithiasis Father    Hypertension Father    Diabetes Father    Seizures Father    Mental illness Sister        bipolar    Seizures Brother    Mental illness Brother        bipolar   Cirrhosis Brother        ETOH use   Cancer Maternal Aunt        stomach and uterine   Cancer Maternal Grandmother        ovarian   Cancer Paternal Grandmother        pancreatic     Current Outpatient Medications:    doxycycline (ADOXA) 50 MG tablet, Take 50 mg by mouth as needed., Disp: , Rfl:    escitalopram (LEXAPRO) 10 MG tablet, Take 1 tablet (10 mg total) by mouth daily., Disp: 30 tablet, Rfl: 2   rizatriptan (MAXALT) 10 MG tablet, Take 1 tablet (10 mg total) by mouth as needed for migraine. May repeat in 2 hours if needed. Max dose 2 pills in 24 hours, Disp: 10 tablet, Rfl: 6    Semaglutide-Weight Management (WEGOVY) 1 MG/0.5ML SOAJ, Inject 1 mg into the skin once a week., Disp: 2 mL, Rfl: 0   silver sulfADIAZINE (SILVADENE) 1 % cream, Apply 1 application topically daily., Disp: 50 g, Rfl: 0   spironolactone (ALDACTONE) 50 MG tablet, Take 1 tablet (50 mg total) by mouth daily., Disp: 90 tablet, Rfl: 0   LORazepam (ATIVAN) 2 MG tablet, Take 1-2 pills 30 minutes prior to MRI (Patient not taking: Reported on 03/21/2022), Disp: 2 tablet, Rfl: 0   Allergies  Allergen Reactions   Prochlorperazine Other (See Comments)    juandice       The patient states she uses none for birth control. Last LMP was No LMP recorded. (Menstrual status: Irregular Periods).. Negative for Dysmenorrhea. Negative for: breast discharge, breast lump(s), breast pain and breast self exam. Associated symptoms include abnormal vaginal bleeding. Pertinent negatives include abnormal bleeding (hematology), anxiety, decreased libido, depression, difficulty  falling sleep, dyspareunia, history of infertility, nocturia, sexual dysfunction, sleep disturbances, urinary incontinence, urinary urgency, vaginal discharge and vaginal itching. Diet regular.The patient states her exercise level is  intermittent.  . The patient's tobacco use is:  Social History   Tobacco Use  Smoking Status Never  Smokeless Tobacco Never  . She has been exposed to passive smoke. The patient's alcohol use is:  Social History   Substance and Sexual Activity  Alcohol Use No   Review of Systems  Constitutional: Negative.   HENT: Negative.    Eyes: Negative.   Respiratory: Negative.    Cardiovascular: Negative.   Gastrointestinal: Negative.   Endocrine: Negative.   Genitourinary: Negative.   Musculoskeletal: Negative.   Skin: Negative.   Allergic/Immunologic: Negative.   Neurological: Negative.   Hematological: Negative.   Psychiatric/Behavioral:  The patient is nervous/anxious.      Today's Vitals   03/21/22 0854  BP:  118/80  Pulse: 82  Weight: 270 lb (122.5 kg)  Height: _0  (1.727 m)   Body mass index is 41.05 kg/m.  Wt Readings from Last 3 Encounters:  03/21/22 270 lb (122.5 kg)  02/19/22 277 lb 2 oz (125.7 kg)  09/13/21 247 lb (112 kg)    Objective:  Physical Exam Vitals and nursing note reviewed.  Constitutional:      Appearance: Normal appearance. She is obese.  HENT:     Head: Normocephalic and atraumatic.     Right Ear: Tympanic membrane, ear canal and external ear normal.     Left Ear: Tympanic membrane, ear canal and external ear normal.     Nose:     Comments: Masked     Mouth/Throat:     Comments: Masked  Eyes:     Extraocular Movements: Extraocular movements intact.     Conjunctiva/sclera: Conjunctivae normal.     Pupils: Pupils are equal, round, and reactive to light.  Cardiovascular:     Rate and Rhythm: Normal rate and regular rhythm.     Pulses: Normal pulses.     Heart sounds: Normal heart sounds.  Pulmonary:     Effort: Pulmonary effort is normal.     Breath sounds: Normal breath sounds.  Chest:  Breasts:    Tanner Score is 5.     Right: Normal.     Left: Normal.  Abdominal:     General: Bowel sounds are normal.     Palpations: Abdomen is soft.     Comments: Obese, soft. Difficult to assess organomegaly  Genitourinary:    Comments: deferred Musculoskeletal:        General: Normal range of motion.     Cervical back: Normal range of motion and neck supple.  Skin:    General: Skin is warm and dry.  Neurological:     General: No focal deficit present.     Mental Status: She is alert and oriented to person, place, and time.  Psychiatric:        Mood and Affect: Mood normal.        Behavior: Behavior normal.      Assessment And Plan:     1. Encounter for general adult medical examination w/o abnormal findings Comments: A full exam was performed. Importance of monthly self breast exams was discussed with the patient. PATIENT IS ADVISED TO GET 30-45  MINUTES REGULAR EXERCISE NO LESS THAN FOUR TO FIVE DAYS PER WEEK - BOTH WEIGHTBEARING EXERCISES AND AEROBIC ARE RECOMMENDED.  PATIENT IS ADVISED TO FOLLOW A HEALTHY DIET WITH  AT LEAST SIX FRUITS/VEGGIES PER DAY, DECREASE INTAKE OF RED MEAT, AND TO INCREASE FISH INTAKE TO TWO DAYS PER WEEK.  MEATS/FISH SHOULD NOT BE FRIED, BAKED OR BROILED IS PREFERABLE.  IT IS ALSO IMPORTANT TO CUT BACK ON YOUR SUGAR INTAKE. PLEASE AVOID ANYTHING WITH ADDED SUGAR, CORN SYRUP OR OTHER SWEETENERS. IF YOU MUST USE A SWEETENER, YOU CAN TRY STEVIA. IT IS ALSO IMPORTANT TO AVOID ARTIFICIALLY SWEETENERS AND DIET BEVERAGES. LASTLY, I SUGGEST WEARING SPF 50 SUNSCREEN ON EXPOSED PARTS AND ESPECIALLY WHEN IN THE DIRECT SUNLIGHT FOR AN EXTENDED PERIOD OF TIME.  PLEASE AVOID FAST FOOD RESTAURANTS AND INCREASE YOUR WATER INTAKE. - CBC - CMP14+EGFR - Lipid panel - TSH - Insulin, random(561)  2. Situational anxiety Comments: I will send rx escitalopram, 72m to be taken once daily in the evenings. Possible AE d/w patient. She agrees to f/u appt in 6wks.  - escitalopram (LEXAPRO) 10 MG tablet; Take 1 tablet (10 mg total) by mouth daily.  Dispense: 30 tablet; Refill: 2  3. Class 3 severe obesity due to excess calories with serious comorbidity and body mass index (BMI) of 40.0 to 44.9 in adult ([facility] Comments: She was congratulated on 7lb weight loss since Sept 2023. She is interested in pharmaceutical meds to aid her efforts. Patient has not met goal of at least 5% of body weight loss with comprehensive lifestyle modifications alone in the past 3-6 months. Pharmacotherapy is appropriate to pursue as augmentation. [facility]. She understands concerns for product shortages and the concerns of starting/stopping in relation to GI distress.     Confirmed patient not pregnant and no personal or family history of medullary thyroid carcinoma (MTC) or Multiple Endocrine Neoplasia syndrome type 2 (MEN 2).     Advised patient on common side effects including nausea, diarrhea, dyspepsia, decreased appetite, and fatigue. Counseled patient on reducing meal size and how to titrate medication to minimize side effects. Patient aware to call if intolerable side effects or if experiencing dehydration, abdominal pain, or dizziness. Patient will adhere to dietary modifications and will target at least 150 minutes of moderate intensity exercise weekly.    4. Influenza vaccination declined  Patient was given opportunity to ask questions. Patient verbalized understanding of the plan and was able to repeat key elements of the plan. All questions were answered to their satisfaction.   I, RMaximino Greenland MD, have reviewed all documentation for this visit. The documentation on 03/21/22 for the exam, diagnosis, procedures, and orders are all accurate and complete.   THE PATIENT IS ENCOURAGED TO PRACTICE SOCIAL DISTANCING DUE TO THE COVID-19 PANDEMIC.

## 2022-03-22 LAB — CBC
Hematocrit: 44.6 % (ref 34.0–46.6)
Hemoglobin: 14.8 g/dL (ref 11.1–15.9)
MCH: 30.8 pg (ref 26.6–33.0)
MCHC: 33.2 g/dL (ref 31.5–35.7)
MCV: 93 fL (ref 79–97)
Platelets: 235 10*3/uL (ref 150–450)
RBC: 4.81 x10E6/uL (ref 3.77–5.28)
RDW: 12.4 % (ref 11.7–15.4)
WBC: 7 10*3/uL (ref 3.4–10.8)

## 2022-03-22 LAB — CMP14+EGFR
ALT: 12 IU/L (ref 0–32)
AST: 18 IU/L (ref 0–40)
Albumin/Globulin Ratio: 1.3 (ref 1.2–2.2)
Albumin: 4.2 g/dL (ref 3.8–4.9)
Alkaline Phosphatase: 105 IU/L (ref 44–121)
BUN/Creatinine Ratio: 9 (ref 9–23)
BUN: 9 mg/dL (ref 6–24)
Bilirubin Total: 0.5 mg/dL (ref 0.0–1.2)
CO2: 21 mmol/L (ref 20–29)
Calcium: 9.1 mg/dL (ref 8.7–10.2)
Chloride: 102 mmol/L (ref 96–106)
Creatinine, Ser: 0.96 mg/dL (ref 0.57–1.00)
Globulin, Total: 3.3 g/dL (ref 1.5–4.5)
Glucose: 104 mg/dL — ABNORMAL HIGH (ref 70–99)
Potassium: 4 mmol/L (ref 3.5–5.2)
Sodium: 139 mmol/L (ref 134–144)
Total Protein: 7.5 g/dL (ref 6.0–8.5)
eGFR: 71 mL/min/{1.73_m2} (ref >59)

## 2022-03-22 LAB — LIPID PANEL
Chol/HDL Ratio: 3.8 ratio (ref 0.0–4.4)
Cholesterol, Total: 171 mg/dL (ref 100–199)
HDL: 45 mg/dL (ref >39)
LDL Chol Calc (NIH): 110 mg/dL — ABNORMAL HIGH (ref 0–99)
Triglycerides: 87 mg/dL (ref 0–149)
VLDL Cholesterol Cal: 16 mg/dL (ref 5–40)

## 2022-03-22 LAB — INSULIN, RANDOM: INSULIN: 19.6 u[IU]/mL (ref 2.6–24.9)

## 2022-03-22 LAB — TSH: TSH: 2.77 u[IU]/mL (ref 0.450–4.500)

## 2022-03-25 NOTE — Telephone Encounter (Signed)
Faxed updated Catherine Elliott: [phone] exp. 03/25/22-04/23/22 for [place] in [place]

## 2022-04-02 ENCOUNTER — Other Ambulatory Visit (HOSPITAL_COMMUNITY): Payer: Self-pay

## 2022-04-03 ENCOUNTER — Telehealth: Payer: Self-pay

## 2022-04-03 NOTE — Telephone Encounter (Signed)
Patient and pharmacy notified that her prior auth for [employer] has been approved through 11/01/2022. YL,RMA

## 2022-04-04 ENCOUNTER — Other Ambulatory Visit (HOSPITAL_COMMUNITY): Payer: Self-pay

## 2022-04-12 ENCOUNTER — Other Ambulatory Visit: Payer: Self-pay | Admitting: Internal Medicine

## 2022-04-12 DIAGNOSIS — F418 Other specified anxiety disorders: Secondary | ICD-10-CM

## 2022-04-17 ENCOUNTER — Encounter: Payer: Self-pay | Admitting: Internal Medicine

## 2022-04-17 ENCOUNTER — Encounter: Payer: Self-pay | Admitting: Psychiatry

## 2022-04-17 NOTE — Telephone Encounter (Signed)
noted 

## 2022-04-19 ENCOUNTER — Telehealth: Payer: Self-pay | Admitting: Neurology

## 2022-04-19 NOTE — Telephone Encounter (Signed)
Received the MRI report completed on the patient ordered by Dr Delena Bali. Will provide the result to Dr Delena Bali for review.

## 2022-04-22 ENCOUNTER — Encounter: Payer: Self-pay | Admitting: Neurology

## 2022-04-22 NOTE — Telephone Encounter (Signed)
MRI of the brain shows "nonspecific foci of T2 signal change", which are essentially small spots on the brain that are commonly seen in patients who have migraines. They do not cause symptoms or require further workup. MRI is otherwise unremarkable and does not show anything that would cause headaches

## 2022-04-22 NOTE — Telephone Encounter (Signed)
Sent the patient a mychart message.

## 2022-04-29 ENCOUNTER — Other Ambulatory Visit (HOSPITAL_COMMUNITY): Payer: Self-pay

## 2022-04-29 ENCOUNTER — Other Ambulatory Visit: Payer: Self-pay | Admitting: Internal Medicine

## 2022-04-29 MED ORDER — WEGOVY 1 MG/0.5ML ~~LOC~~ SOAJ
1.0000 mg | SUBCUTANEOUS | 0 refills | Status: DC
  Filled 2022-04-29: qty 2, 28d supply, fill #0

## 2022-05-19 ENCOUNTER — Encounter: Payer: Self-pay | Admitting: Internal Medicine

## 2022-05-27 ENCOUNTER — Other Ambulatory Visit: Payer: Self-pay | Admitting: Internal Medicine

## 2022-05-29 ENCOUNTER — Other Ambulatory Visit (HOSPITAL_COMMUNITY): Payer: Self-pay

## 2022-05-29 MED ORDER — WEGOVY 1 MG/0.5ML ~~LOC~~ SOAJ
1.0000 mg | SUBCUTANEOUS | 0 refills | Status: DC
  Filled 2022-05-29: qty 2, 28d supply, fill #0

## 2022-06-04 ENCOUNTER — Other Ambulatory Visit (HOSPITAL_COMMUNITY): Payer: Self-pay

## 2022-06-12 ENCOUNTER — Other Ambulatory Visit (HOSPITAL_COMMUNITY): Payer: Self-pay

## 2022-06-12 ENCOUNTER — Ambulatory Visit: Payer: BC Managed Care – PPO | Admitting: Family Medicine

## 2022-06-18 NOTE — Progress Notes (Signed)
Chief Complaint  Patient presents with   Room 2    Pt is here Alone. Pt states that her migraines have been doing good. Pt states that she had 5-6 migraines since the last time she was here.     HISTORY OF PRESENT ILLNESS:  06/19/22 ALL:  Catherine Elliott is a 52 y.o. female here today for follow up for migraines. She was seen in consult with Dr Delena Bali 02/2022. MRI was unremarkable. She was started on rizatriptan as needed for abortive therapy. Since, she reports doing well. She has had 4-5 migraines since being seen. Rizatriptan works well for abortive therapy. She has had to repeat dose once. Rizatriptan was well tolerated. Most headaches start in the mornings. No obvious signs of sleep apnea. Sister does have OSA. BP is usually well managed (130's/80's).   HISTORY (copied from Dr Quentin Mulling previous note)  Medical co-morbidities: hidradenitis suppurativa   The patient presents for evaluation of headaches which she's had for several years, but have worsened in the past year. She does note increased stress in her life recently as she has been helping take care of her father.   Headaches are described as frontal throbbing with associated phonophobia and nausea. They can last up to 3 days at a time. Headache frequency varies. In the past 3-4 months she has had multiple per week, though she has recently gone 2 weeks without one.   Has never been diagnosed with migraines, but was diagnosed with "sinus headaches" as a kid.   [facility] 12/31/21 was unremarkable.   Headache History: Onset: worsened 1 year ago Triggers: stress Aura: none Location: frontal Quality/Description: throbbing, aching Associated Symptoms:             Photophobia: yes             Phonophobia: no             Nausea: yes Worse with activity?: yes Duration of headaches: several days   Headache days per month: 8 Headache free days per month: 22   Current Treatment: Abortive Tylenol Excedrin   Preventative none    Prior Therapies                                 Tylenol Excedrin   REVIEW OF SYSTEMS: Out of a complete 14 system review of symptoms, the patient complains only of the following symptoms, headaches and all other reviewed systems are negative.   ALLERGIES: Allergies  Allergen Reactions   Prochlorperazine Other (See Comments)    juandice      HOME MEDICATIONS: Outpatient Medications Prior to Visit  Medication Sig Dispense Refill   doxycycline (ADOXA) 50 MG tablet Take 50 mg by mouth as needed.     escitalopram (LEXAPRO) 10 MG tablet TAKE 1 TABLET BY MOUTH EVERY DAY 90 tablet 1   LORazepam (ATIVAN) 2 MG tablet Take 1-2 pills 30 minutes prior to MRI 2 tablet 0   rizatriptan (MAXALT) 10 MG tablet Take 1 tablet (10 mg total) by mouth as needed for migraine. May repeat in 2 hours if needed. Max dose 2 pills in 24 hours 10 tablet 6   Semaglutide-Weight Management (WEGOVY) 1 MG/0.5ML SOAJ Inject 1 mg into the skin once a week. 2 mL 0   silver sulfADIAZINE (SILVADENE) 1 % cream Apply 1 application topically daily. 50 g 0   spironolactone (ALDACTONE) 50 MG tablet Take 1 tablet (50 mg  total) by mouth daily. 90 tablet 0   No facility-administered medications prior to visit.     PAST MEDICAL HISTORY: Past Medical History:  Diagnosis Date   H/O ulcerative colitis    H/O: depression    History of migraine headaches    frequent   Hx: UTI (urinary tract infection)    Left ovarian cyst    resolved 11/2010   LLQ pain    h/o   Pilonidal cyst    h/o x 2     PAST SURGICAL HISTORY: Past Surgical History:  Procedure Laterality Date   CHOLECYSTECTOMY N/A 04/20/2019   Procedure: Laparoscopic Cholecystectomy;  Surgeon: Erroll Luna, MD;  Location: [place];  Service: General;  Laterality: N/A;   COLONOSCOPY     x5   ESOPHAGOGASTRODUODENOSCOPY     EYE SURGERY     lasix   PILONIDAL CYST EXCISION  2008/2011     FAMILY HISTORY: Family History  Problem Relation Age of Onset   COPD  Mother    Cancer Mother        ovarian   Hypertension Mother    Heart disease Father    Nephrolithiasis Father    Hypertension Father    Diabetes Father    Seizures Father    Mental illness Sister        bipolar    Seizures Brother    Mental illness Brother        bipolar   Cirrhosis Brother        ETOH use   Cancer Maternal Aunt        stomach and uterine   Cancer Maternal Grandmother        ovarian   Cancer Paternal Grandmother        pancreatic     SOCIAL HISTORY: Social History   Socioeconomic History   Marital status: Single    Spouse name: Not on file   Number of children: Not on file   Years of education: Not on file   Highest education level: Not on file  Occupational History   Not on file  Tobacco Use   Smoking status: Never   Smokeless tobacco: Never  Vaping Use   Vaping Use: Never used  Substance and Sexual Activity   Alcohol use: No   Drug use: No   Sexual activity: Not Currently    Birth control/protection: Abstinence  Other Topics Concern   Not on file  Social History Narrative   Not on file   Social Determinants of Health   Financial Resource Strain: Not on file  Food Insecurity: Not on file  Transportation Needs: Not on file  Physical Activity: Not on file  Stress: Not on file  Social Connections: Not on file  Intimate Partner Violence: Not on file     PHYSICAL EXAM  Vitals:   06/19/22 1107  BP: (!) 150/82  Pulse: 75  Weight: 265 lb 8 oz (120.4 kg)  Height: 5\' 8"  (1.727 m)   Body mass index is 40.37 kg/m.  Generalized: Well developed, in no acute distress  Cardiology: normal rate and rhythm, no murmur auscultated  Respiratory: clear to auscultation bilaterally    Neurological examination  Mentation: Alert oriented to time, place, history taking. Follows all commands speech and language fluent Cranial nerve II-XII: Pupils were equal round reactive to light. Extraocular movements were full, visual field were full on  confrontational test. Facial sensation and strength were normal. Uvula tongue midline. Head turning and shoulder shrug  were normal  and symmetric. Motor: The motor testing reveals 5 over 5 strength of all 4 extremities. Good symmetric motor tone is noted throughout.  Sensory: Sensory testing is intact to soft touch on all 4 extremities. No evidence of extinction is noted.  Coordination: Cerebellar testing reveals good finger-nose-finger and heel-to-shin bilaterally.  Gait and station: Gait is normal. Tandem gait is normal. Romberg is negative. No drift is seen.  Reflexes: Deep tendon reflexes are symmetric and normal bilaterally.    DIAGNOSTIC DATA (LABS, IMAGING, TESTING) - I reviewed patient records, labs, notes, testing and imaging myself where available.  Lab Results  Component Value Date   WBC 7.0 03/21/2022   HGB 14.8 03/21/2022   HCT 44.6 03/21/2022   MCV 93 03/21/2022   PLT 235 03/21/2022      Component Value Date/Time   NA 139 03/21/2022 1010   K 4.0 03/21/2022 1010   CL 102 03/21/2022 1010   CO2 21 03/21/2022 1010   GLUCOSE 104 (H) 03/21/2022 1010   GLUCOSE 105 (H) 04/14/2019 0814   BUN 9 03/21/2022 1010   CREATININE 0.96 03/21/2022 1010   CALCIUM 9.1 03/21/2022 1010   PROT 7.5 03/21/2022 1010   ALBUMIN 4.2 03/21/2022 1010   AST 18 03/21/2022 1010   ALT 12 03/21/2022 1010   ALKPHOS 105 03/21/2022 1010   BILITOT 0.5 03/21/2022 1010   GFRNONAA 62 02/17/2020 0959   GFRAA 72 02/17/2020 0959   Lab Results  Component Value Date   CHOL 171 03/21/2022   HDL 45 03/21/2022   LDLCALC 110 (H) 03/21/2022   TRIG 87 03/21/2022   CHOLHDL 3.8 03/21/2022   Lab Results  Component Value Date   HGBA1C 5.3 02/09/2019   No results found for: "VITAMINB12" Lab Results  Component Value Date   TSH 2.770 03/21/2022        No data to display               No data to display           ASSESSMENT AND PLAN  52 y.o. year old female  has a past medical history of  H/O ulcerative colitis, H/O: depression, History of migraine headaches, UTI (urinary tract infection), Left ovarian cyst, LLQ pain, and Pilonidal cyst. here with    Migraine without aura and without status migrainosus, not intractable  Nazareth M Kloos reports migraines are well managed. We will continue rizatriptan as needed. Marland KitchenShe will continue to monitor BP and monitor for any concerns of sleep apnea. Healthy lifestyle habits encouraged. She will follow up with PCP as directed. She will return to see me in 1 year, sooner if needed. She verbalizes understanding and agreement with this plan.   No orders of the defined types were placed in this encounter.    No orders of the defined types were placed in this encounter.    Debbora Presto, MSN, FNP-C 06/19/2022, 11:47 AM  [facility] [address], [place], [place] [address] [phone]

## 2022-06-18 NOTE — Patient Instructions (Signed)
Below is our plan:  We will continue rizatriptan as needed. Please take 1 tablet at onset of headache. May take 1 additional tablet in 2 hours if needed. Do not take more than 2 tablets in 24 hours or more than 10 in a month.   Please make sure you are staying well hydrated. I recommend 50-60 ounces daily. Well balanced diet and regular exercise encouraged. Consistent sleep schedule with 6-8 hours recommended.   Please continue follow up with care team as directed.   Follow up with me in 1 year   You may receive a survey regarding today's visit. I encourage you to leave honest feed back as I do use this information to improve patient care. Thank you for seeing me today!   GENERAL HEADACHE INFORMATION:   Natural supplements: Magnesium Oxide or Magnesium Glycinate 500 mg at bed (up to 800 mg daily) Coenzyme Q10 300 mg in AM Vitamin B2- 200 mg twice a day   Add 1 supplement at a time since even natural supplements can have undesirable side effects. You can sometimes buy supplements cheaper (especially Coenzyme Q10) at www.WebmailGuide.co.za or at [employer].   Vitamins and herbs that show potential:   Magnesium: Magnesium (250 mg twice a day or 500 mg at bed) has a relaxant effect on smooth muscles such as blood vessels. Individuals suffering from frequent or daily headache usually have low magnesium levels which can be increase with daily supplementation of 400-750 mg. Three trials found 40-90% average headache reduction  when used as a preventative. Magnesium also demonstrated the benefit in menstrually related migraine.  Magnesium is part of the messenger system in the serotonin cascade and it is a good muscle relaxant.  It is also useful for constipation which can be a side effect of other medications used to treat migraine. Good sources include nuts, whole grains, and tomatoes. Side Effects: loose stool/diarrhea  Riboflavin (vitamin B 2) 200 mg twice a day. This vitamin assists nerve cells in the  production of ATP a principal energy storing molecule.  It is necessary for many chemical reactions in the body.  There have been at least 3 clinical trials of riboflavin using 400 mg per day all of which suggested that migraine frequency can be decreased.  All 3 trials showed significant improvement in over half of migraine sufferers.  The supplement is found in bread, cereal, milk, meat, and poultry.  Most Americans get more riboflavin than the recommended daily allowance, however riboflavin deficiency is not necessary for the supplements to help prevent headache. Side effects: energizing, green urine   Coenzyme Q10: This is present in almost all cells in the body and is critical component for the conversion of energy.  Recent studies have shown that a nutritional supplement of CoQ10 can reduce the frequency of migraine attacks by improving the energy production of cells as with riboflavin.  Doses of 150 mg twice a day have been shown to be effective.   Melatonin: Increasing evidence shows correlation between melatonin secretion and headache conditions.  Melatonin supplementation has decreased headache intensity and duration.  It is widely used as a sleep aid.  Sleep is natures way of dealing with migraine.  A dose of 3 mg is recommended to start for headaches including cluster headache. Higher doses up to 15 mg has been reviewed for use in Cluster headache and have been used. The rationale behind using melatonin for cluster is that many theories regarding the cause of Cluster headache center around the disruption  of the normal circadian rhythm in the brain.  This helps restore the normal circadian rhythm.   HEADACHE DIET: Foods and beverages which may trigger migraine Note that only 20% of headache patients are food sensitive. You will know if you are food sensitive if you get a headache consistently 20 minutes to 2 hours after eating a certain food. Only cut out a food if it causes headaches, otherwise  you might remove foods you enjoy! What matters most for diet is to eat a well balanced healthy diet full of vegetables and low fat protein, and to not miss meals.   Chocolate, other sweets ALL cheeses except cottage and cream cheese Dairy products, yogurt, sour cream, ice cream Liver Meat extracts (Bovril, Marmite, meat tenderizers) Meats or fish which have undergone aging, fermenting, pickling or smoking. These include: Hotdogs,salami,Lox,sausage, mortadellas,smoked salmon, pepperoni, Pickled herring Pods of broad bean (English beans, Chinese pea pods, [place] (fava) beans, lima and navy beans Ripe avocado, ripe banana Yeast extracts or active yeast preparations such as Brewer's or Fleishman's (commercial bakes goods are permitted) Tomato based foods, pizza (lasagna, etc.)   MSG (monosodium glutamate) is disguised as many things; look for these common aliases: Monopotassium glutamate Autolysed yeast Hydrolysed protein Sodium caseinate "flavorings" "all natural preservatives" Nutrasweet   Avoid all other foods that convincingly provoke headaches.   Resources: The Dizzy Lu Duffel Your Headache Diet, migrainestrong.com  https://www.aguirre.org/   Caffeine and Migraine For patients that have migraine, caffeine intake more than 3 days per week can lead to dependency and increased migraine frequency. I would recommend cutting back on your caffeine intake as best you can. The recommended amount of caffeine is 200-300 mg daily, although migraine patients may experience dependency at even lower doses. While you may notice an increase in headache temporarily, cutting back will be helpful for headaches in the long run. For more information on caffeine and migraine, visit: https://americanmigrainefoundation.org/resource-library/caffeine-and-migraine/   Headache Prevention Strategies:   1. Maintain a headache diary; learn to identify and avoid  triggers.  - This can be a simple note where you log when you had a headache, associated symptoms, and medications used - There are several smartphone apps developed to help track migraines: Migraine Buddy, Migraine Monitor, Curelator N1-Headache App   Common triggers include: Emotional triggers: Emotional/Upset family or friends Emotional/Upset occupation Business reversal/success Anticipation anxiety Crisis-serious Post-crisis periodNew job/position   Physical triggers: Vacation Day Weekend Strenuous Exercise High Altitude Location New Move Menstrual Day Physical Illness Oversleep/Not enough sleep Weather changes Light: Photophobia or light sesnitivity treatment involves a balance between desensitization and reduction in overly strong input. Use dark polarized glasses outside, but not inside. Avoid bright or fluorescent light, but do not dim environment to the point that going into a normally lit room hurts. Consider FL-41 tint lenses, which reduce the most irritating wavelengths without blocking too much light.  These can be obtained at axonoptics.com or theraspecs.com Foods: see list above.   2. Limit use of acute treatments (over-the-counter medications, triptans, etc.) to no more than 2 days per week or 10 days per month to prevent medication overuse headache (rebound headache).     3. Follow a regular schedule (including weekends and holidays): Don't skip meals. Eat a balanced diet. 8 hours of sleep nightly. Minimize stress. Exercise 30 minutes per day. Being overweight is associated with a 5 times increased risk of chronic migraine. Keep well hydrated and drink 6-8 glasses of water per day.   4. Initiate non-pharmacologic measures at the  earliest onset of your headache. Rest and quiet environment. Relax and reduce stress. Breathe2Relax is a free app that can instruct you on    some simple relaxtion and breathing techniques. Http://Dawnbuse.com is a    free website that  provides teaching videos on relaxation.  Also, there are  many apps that   can be downloaded for "mindful" relaxation.  An app called YOGA NIDRA will help walk you through mindfulness. Another app called Calm can be downloaded to give you a structured mindfulness guide with daily reminders and skill development. Headspace for guided meditation Mindfulness Based Stress Reduction Online Course: www.palousemindfulness.com Cold compresses.   5. Don't wait!! Take the maximum allowable dosage of prescribed medication at the first sign of migraine.   6. Compliance:  Take prescribed medication regularly as directed and at the first sign of a migraine.   7. Communicate:  Call your physician when problems arise, especially if your headaches change, increase in frequency/severity, or become associated with neurological symptoms (weakness, numbness, slurred speech, etc.).   8. Headache/pain management therapies: Consider various complementary methods, including medication, behavioral therapy, psychological counselling, biofeedback, massage therapy, acupuncture, dry needling, and other modalities.  Such measures may reduce the need for medications. Counseling for pain management, where patients learn to function and ignore/minimize their pain, seems to work very well.   9. Recommend changing family's attention and focus away from patient's headaches. Instead, emphasize daily activities. If first question of day is 'How are your headaches/Do you have a headache today?', then patient will constantly think about headaches, thus making them worse. Goal is to re-direct attention away from headaches, toward daily activities and other distractions.   10. Helpful Websites: www.AmericanHeadacheSociety.org VoipObserver.it www.headaches.org GolfingFamily.no www.achenet.org

## 2022-06-19 ENCOUNTER — Encounter: Payer: Self-pay | Admitting: Family Medicine

## 2022-06-19 ENCOUNTER — Ambulatory Visit: Payer: BC Managed Care – PPO | Admitting: Family Medicine

## 2022-06-19 VITALS — BP 150/82 | HR 75 | Ht 68.0 in | Wt 265.5 lb

## 2022-06-19 DIAGNOSIS — G43009 Migraine without aura, not intractable, without status migrainosus: Secondary | ICD-10-CM

## 2022-06-25 ENCOUNTER — Other Ambulatory Visit: Payer: Self-pay | Admitting: Internal Medicine

## 2022-06-25 DIAGNOSIS — Z1231 Encounter for screening mammogram for malignant neoplasm of breast: Secondary | ICD-10-CM

## 2022-06-27 ENCOUNTER — Other Ambulatory Visit (HOSPITAL_COMMUNITY): Payer: Self-pay

## 2022-07-04 ENCOUNTER — Other Ambulatory Visit (HOSPITAL_COMMUNITY): Payer: Self-pay

## 2022-08-13 ENCOUNTER — Ambulatory Visit
Admission: RE | Admit: 2022-08-13 | Discharge: 2022-08-13 | Disposition: A | Discharge status: Home / Self Care | Payer: BC Managed Care – PPO | Source: Ambulatory Visit | Attending: Internal Medicine | Admitting: Internal Medicine

## 2022-08-13 DIAGNOSIS — Z1231 Encounter for screening mammogram for malignant neoplasm of breast: Secondary | ICD-10-CM

## 2022-08-26 ENCOUNTER — Encounter: Payer: Self-pay | Admitting: Internal Medicine

## 2022-09-24 ENCOUNTER — Ambulatory Visit: Payer: BC Managed Care – PPO | Admitting: Internal Medicine

## 2022-10-08 LAB — HM COLONOSCOPY

## 2022-10-10 ENCOUNTER — Other Ambulatory Visit: Payer: Self-pay | Admitting: Internal Medicine

## 2022-10-10 DIAGNOSIS — F418 Other specified anxiety disorders: Secondary | ICD-10-CM

## 2022-10-29 ENCOUNTER — Encounter: Payer: Self-pay | Admitting: Internal Medicine

## 2023-04-09 ENCOUNTER — Encounter: Payer: Self-pay | Admitting: Internal Medicine

## 2023-06-12 ENCOUNTER — Other Ambulatory Visit: Payer: Self-pay | Admitting: Internal Medicine

## 2023-06-12 DIAGNOSIS — F418 Other specified anxiety disorders: Secondary | ICD-10-CM

## 2023-06-16 ENCOUNTER — Telehealth: Payer: Self-pay | Admitting: Family Medicine

## 2023-06-16 NOTE — Telephone Encounter (Signed)
 Pt called to cx and r/s her appt. Pt requested to change appt to a VV. Pt confirmed there are no changes, no new sx and no need for medication change.

## 2023-06-18 ENCOUNTER — Ambulatory Visit: Payer: Self-pay | Admitting: Family Medicine

## 2023-08-19 NOTE — Progress Notes (Unsigned)
 PATIENT: Catherine Elliott DOB: 1970-12-14  REASON FOR VISIT: follow up HISTORY FROM: patient  Virtual Visit via MyChart video  I connected with Braya Habermehl Reeves on 08/21/23 at  3:30 PM EDT via MyChart video and verified that I am speaking with the correct person using two identifiers.   I discussed the limitations, risks, security and privacy concerns of performing an evaluation and management service by Mychart video and the availability of in person appointments. I also discussed with the patient that there may be a patient responsible charge related to this service. The patient expressed understanding and agreed to proceed.   History of Present Illness:  08/21/23 ALL (Mychart): Catherine Elliott is a 53 y.o. Catherine here today for follow up for migraines. She was last seen 06/2022 and doing well on rizatriptan as needed. Since, she reports doing very well. She may have had 2-3 headaches over the past year. Last migraine 03/2023. Rizatriptan works well.   06/19/22 ALL:  Catherine Elliott is a 53 y.o. Catherine here today for follow up for migraines. She was seen in consult with Dr Delena Bali 02/2022. MRI was unremarkable. She was started on rizatriptan as needed for abortive therapy. Since, she reports doing well. She has had 4-5 migraines since being seen. Rizatriptan works well for abortive therapy. She has had to repeat dose once. Rizatriptan was well tolerated. Most headaches start in the mornings. No obvious signs of sleep apnea. Sister does have OSA. BP is usually well managed (130's/80's).   HISTORY (copied from Dr Quentin Mulling previous note)  Medical co-morbidities: hidradenitis suppurativa   The patient presents for evaluation of headaches which she's had for several years, but have worsened in the past year. She does note increased stress in her life recently as she has been helping take care of her father.   Headaches are described as frontal throbbing with associated phonophobia and nausea. They can  last up to 3 days at a time. Headache frequency varies. In the past 3-4 months she has had multiple per week, though she has recently gone 2 weeks without one.   Has never been diagnosed with migraines, but was diagnosed with "sinus headaches" as a kid.   [facility] was unremarkable.   Headache History: Onset: worsened 1 year ago Triggers: stress Aura: none Location: frontal Quality/Description: throbbing, aching Associated Symptoms:             Photophobia: yes             Phonophobia: no             Nausea: yes Worse with activity?: yes Duration of headaches: several days   Headache days per month: 8 Headache free days per month: 22   Current Treatment: Abortive Tylenol Excedrin   Preventative none   Prior Therapies                                 Tylenol Excedrin   Observations/Objective:  Generalized: Well developed, in no acute distress  Mentation: Alert oriented to time, place, history taking. Follows all commands speech and language fluent   Assessment and Plan:  53 y.o. year old Catherine  has a past medical history of H/O ulcerative colitis, H/O: depression, History of migraine headaches, UTI (urinary tract infection), Left ovarian cyst, LLQ pain, and Pilonidal cyst. here with    ICD-10-CM   1. Migraine without aura and without status migrainosus, not  intractable  G43.009       Charell continues to do well. We will continue rizatriptan as needed. Healthy lifestyle habits encouraged. Follow up with me in 1 year.   No orders of the defined types were placed in this encounter.   Meds ordered this encounter  Medications   rizatriptan (MAXALT) 10 MG tablet    Sig: Take 1 tablet (10 mg total) by mouth as needed for migraine. May repeat in 2 hours if needed. Max dose 2 pills in 24 hours    Dispense:  10 tablet    Refill:  6    Supervising Provider:   Anson Fret [[id number]]     Follow Up Instructions:  I discussed the assessment and treatment plan  with the patient. The patient was provided an opportunity to ask questions and all were answered. The patient agreed with the plan and demonstrated an understanding of the instructions.   The patient was advised to call back or seek an in-person evaluation if the symptoms worsen or if the condition fails to improve as anticipated.  I provided 15 minutes of face-to-face and non face-to-face time during this MyChart video encounter. Patient located at their place of residence. Provider is in the office.    Shawnie Dapper, NP

## 2023-08-19 NOTE — Patient Instructions (Incomplete)
 Below is our plan:  We will continue rizatriptan as needed  Please make sure you are staying well hydrated. I recommend 50-60 ounces daily. Well balanced diet and regular exercise encouraged. Consistent sleep schedule with 6-8 hours recommended.   Please continue follow up with care team as directed.   Follow up with me in 1 year unless PCP willing to fill rizatriptan then you can follow up as needed!  You may receive a survey regarding today's visit. I encourage you to leave honest feed back as I do use this information to improve patient care. Thank you for seeing me today!   GENERAL HEADACHE INFORMATION:   Natural supplements: Magnesium Oxide or Magnesium Glycinate 500 mg at bed (up to 800 mg daily) Coenzyme Q10 300 mg in AM Vitamin B2- 200 mg twice a day   Add 1 supplement at a time since even natural supplements can have undesirable side effects. You can sometimes buy supplements cheaper (especially Coenzyme Q10) at www.WebmailGuide.co.za or at [facility].  Migraine with aura: There is increased risk for stroke in women with migraine with aura and a contraindication for the combined contraceptive pill for use by women who have migraine with aura. The risk for women with migraine without aura is lower. However other risk factors like smoking are far more likely to increase stroke risk than migraine. There is a recommendation for no smoking and for the use of OCPs without estrogen such as progestogen only pills particularly for women with migraine with aura.Marland Kitchen People who have migraine headaches with auras may be 3 times more likely to have a stroke caused by a blood clot, compared to migraine patients who don't see auras. Women who take hormone-replacement therapy may be 30 percent more likely to suffer a clot-based stroke than women not taking medication containing estrogen. Other risk factors like smoking and high blood pressure may be  much more important.    Vitamins and herbs that show  potential:   Magnesium: Magnesium (250 mg twice a day or 500 mg at bed) has a relaxant effect on smooth muscles such as blood vessels. Individuals suffering from frequent or daily headache usually have low magnesium levels which can be increase with daily supplementation of 400-750 mg. Three trials found 40-90% average headache reduction  when used as a preventative. Magnesium may help with headaches are aura, the best evidence for magnesium is for migraine with aura is its thought to stop the cortical spreading depression we believe is the pathophysiology of migraine aura.Magnesium also demonstrated the benefit in menstrually related migraine.  Magnesium is part of the messenger system in the serotonin cascade and it is a good muscle relaxant.  It is also useful for constipation which can be a side effect of other medications used to treat migraine. Good sources include nuts, whole grains, and tomatoes. Side Effects: loose stool/diarrhea  Riboflavin (vitamin B 2) 200 mg twice a day. This vitamin assists nerve cells in the production of ATP a principal energy storing molecule.  It is necessary for many chemical reactions in the body.  There have been at least 3 clinical trials of riboflavin using 400 mg per day all of which suggested that migraine frequency can be decreased.  All 3 trials showed significant improvement in over half of migraine sufferers.  The supplement is found in bread, cereal, milk, meat, and poultry.  Most Americans get more riboflavin than the recommended daily allowance, however riboflavin deficiency is not necessary for the supplements to help prevent headache.  Side effects: energizing, green urine   Coenzyme Q10: This is present in almost all cells in the body and is critical component for the conversion of energy.  Recent studies have shown that a nutritional supplement of CoQ10 can reduce the frequency of migraine attacks by improving the energy production of cells as with  riboflavin.  Doses of 150 mg twice a day have been shown to be effective.   Melatonin: Increasing evidence shows correlation between melatonin secretion and headache conditions.  Melatonin supplementation has decreased headache intensity and duration.  It is widely used as a sleep aid.  Sleep is natures way of dealing with migraine.  A dose of 3 mg is recommended to start for headaches including cluster headache. Higher doses up to 15 mg has been reviewed for use in Cluster headache and have been used. The rationale behind using melatonin for cluster is that many theories regarding the cause of Cluster headache center around the disruption of the normal circadian rhythm in the brain.  This helps restore the normal circadian rhythm.   HEADACHE DIET: Foods and beverages which may trigger migraine Note that only 20% of headache patients are food sensitive. You will know if you are food sensitive if you get a headache consistently 20 minutes to 2 hours after eating a certain food. Only cut out a food if it causes headaches, otherwise you might remove foods you enjoy! What matters most for diet is to eat a well balanced healthy diet full of vegetables and low fat protein, and to not miss meals.   Chocolate, other sweets ALL cheeses except cottage and cream cheese Dairy products, yogurt, sour cream, ice cream Liver Meat extracts (Bovril, Marmite, meat tenderizers) Meats or fish which have undergone aging, fermenting, pickling or smoking. These include: Hotdogs,salami,Lox,sausage, mortadellas,smoked salmon, pepperoni, Pickled herring Pods of broad bean (English beans, Chinese pea pods, [place] (fava) beans, lima and navy beans Ripe avocado, ripe banana Yeast extracts or active yeast preparations such as Brewer's or Fleishman's (commercial bakes goods are permitted) Tomato based foods, pizza (lasagna, etc.)   MSG (monosodium glutamate) is disguised as many things; look for these common  aliases: Monopotassium glutamate Autolysed yeast Hydrolysed protein Sodium caseinate "flavorings" "all natural preservatives" Nutrasweet   Avoid all other foods that convincingly provoke headaches.   Resources: The Dizzy [place] Laundry Your Headache Diet, migrainestrong.com  https://zamora-andrews.com/   Caffeine and Migraine For patients that have migraine, caffeine intake more than 3 days per week can lead to dependency and increased migraine frequency. I would recommend cutting back on your caffeine intake as best you can. The recommended amount of caffeine is 200-300 mg daily, although migraine patients may experience dependency at even lower doses. While you may notice an increase in headache temporarily, cutting back will be helpful for headaches in the long run. For more information on caffeine and migraine, visit: https://americanmigrainefoundation.org/resource-library/caffeine-and-migraine/   Headache Prevention Strategies:   1. Maintain a headache diary; learn to identify and avoid triggers.  - This can be a simple note where you log when you had a headache, associated symptoms, and medications used - There are several smartphone apps developed to help track migraines: Migraine Buddy, Migraine Monitor, Curelator N1-Headache App   Common triggers include: Emotional triggers: Emotional/Upset family or friends Emotional/Upset occupation Business reversal/success Anticipation anxiety Crisis-serious Post-crisis periodNew job/position   Physical triggers: Vacation Day Weekend Strenuous Exercise High Altitude Location New Move Menstrual Day Physical Illness Oversleep/Not enough sleep Weather changes Light: Photophobia or light sesnitivity treatment  involves a balance between desensitization and reduction in overly strong input. Use dark polarized glasses outside, but not inside. Avoid bright or fluorescent light, but do not dim  environment to the point that going into a normally lit room hurts. Consider FL-41 tint lenses, which reduce the most irritating wavelengths without blocking too much light.  These can be obtained at axonoptics.com or theraspecs.com Foods: see list above.   2. Limit use of acute treatments (over-the-counter medications, triptans, etc.) to no more than 2 days per week or 10 days per month to prevent medication overuse headache (rebound headache).     3. Follow a regular schedule (including weekends and holidays): Don't skip meals. Eat a balanced diet. 8 hours of sleep nightly. Minimize stress. Exercise 30 minutes per day. Being overweight is associated with a 5 times increased risk of chronic migraine. Keep well hydrated and drink 6-8 glasses of water per day.   4. Initiate non-pharmacologic measures at the earliest onset of your headache. Rest and quiet environment. Relax and reduce stress. Breathe2Relax is a free app that can instruct you on    some simple relaxtion and breathing techniques. Http://Dawnbuse.com is a    free website that provides teaching videos on relaxation.  Also, there are  many apps that   can be downloaded for "mindful" relaxation.  An app called YOGA NIDRA will help walk you through mindfulness. Another app called Calm can be downloaded to give you a structured mindfulness guide with daily reminders and skill development. Headspace for guided meditation Mindfulness Based Stress Reduction Online Course: www.palousemindfulness.com Cold compresses.   5. Don't wait!! Take the maximum allowable dosage of prescribed medication at the first sign of migraine.   6. Compliance:  Take prescribed medication regularly as directed and at the first sign of a migraine.   7. Communicate:  Call your physician when problems arise, especially if your headaches change, increase in frequency/severity, or become associated with neurological symptoms (weakness, numbness, slurred speech, etc.).  Proceed to emergency room if you experience new or worsening symptoms or symptoms do not resolve, if you have new neurologic symptoms or if headache is severe, or for any concerning symptom.   8. Headache/pain management therapies: Consider various complementary methods, including medication, behavioral therapy, psychological counselling, biofeedback, massage therapy, acupuncture, dry needling, and other modalities.  Such measures may reduce the need for medications. Counseling for pain management, where patients learn to function and ignore/minimize their pain, seems to work very well.   9. Recommend changing family's attention and focus away from patient's headaches. Instead, emphasize daily activities. If first question of day is 'How are your headaches/Do you have a headache today?', then patient will constantly think about headaches, thus making them worse. Goal is to re-direct attention away from headaches, toward daily activities and other distractions.   10. Helpful Websites: www.AmericanHeadacheSociety.org PatentHood.ch www.headaches.org TightMarket.nl www.achenet.org

## 2023-08-21 ENCOUNTER — Telehealth (INDEPENDENT_AMBULATORY_CARE_PROVIDER_SITE_OTHER): Payer: Self-pay | Admitting: Family Medicine

## 2023-08-21 ENCOUNTER — Encounter: Payer: Self-pay | Admitting: Family Medicine

## 2023-08-21 ENCOUNTER — Other Ambulatory Visit (HOSPITAL_COMMUNITY): Payer: Self-pay

## 2023-08-21 DIAGNOSIS — G43009 Migraine without aura, not intractable, without status migrainosus: Secondary | ICD-10-CM

## 2023-08-21 MED ORDER — RIZATRIPTAN BENZOATE 10 MG PO TABS
10.0000 mg | ORAL_TABLET | ORAL | 6 refills | Status: AC | PRN
  Filled 2023-08-21: qty 10, 5d supply, fill #0

## 2023-09-01 ENCOUNTER — Encounter: Payer: BC Managed Care – PPO | Admitting: Internal Medicine

## 2023-09-02 ENCOUNTER — Other Ambulatory Visit (HOSPITAL_COMMUNITY): Payer: Self-pay

## 2023-09-03 ENCOUNTER — Encounter: Payer: BC Managed Care – PPO | Admitting: Internal Medicine

## 2024-01-05 ENCOUNTER — Encounter: Admitting: Internal Medicine

## 2024-01-28 ENCOUNTER — Other Ambulatory Visit: Payer: Self-pay | Admitting: Internal Medicine

## 2024-01-28 DIAGNOSIS — F418 Other specified anxiety disorders: Secondary | ICD-10-CM

## 2024-01-29 ENCOUNTER — Other Ambulatory Visit: Payer: Self-pay

## 2024-01-29 DIAGNOSIS — Z1231 Encounter for screening mammogram for malignant neoplasm of breast: Secondary | ICD-10-CM

## 2024-01-30 ENCOUNTER — Ambulatory Visit
Admission: RE | Admit: 2024-01-30 | Discharge: 2024-01-30 | Disposition: A | Discharge status: Home / Self Care | Payer: Self-pay | Source: Ambulatory Visit

## 2024-01-30 DIAGNOSIS — Z1231 Encounter for screening mammogram for malignant neoplasm of breast: Secondary | ICD-10-CM

## 2024-05-03 ENCOUNTER — Telehealth: Payer: Self-pay | Admitting: Internal Medicine

## 2024-05-03 NOTE — Telephone Encounter (Signed)
 Called pt to confirm if she is still a pt here. Pt canceled last appt due to moved and needed to clarify.
